# Patient Record
Sex: Female | Born: 1970 | Race: White | Hispanic: No | Marital: Married | Smoking: Never smoker
Health system: Southern US, Community
[De-identification: ages and names within clinical notes are randomized; demographics above are authoritative.]

## PROBLEM LIST (undated history)

## (undated) DIAGNOSIS — K589 Irritable bowel syndrome without diarrhea: Secondary | ICD-10-CM

## (undated) DIAGNOSIS — N879 Dysplasia of cervix uteri, unspecified: Secondary | ICD-10-CM

## (undated) DIAGNOSIS — C4490 Unspecified malignant neoplasm of skin, unspecified: Secondary | ICD-10-CM

## (undated) DIAGNOSIS — I10 Essential (primary) hypertension: Secondary | ICD-10-CM

## (undated) DIAGNOSIS — J302 Other seasonal allergic rhinitis: Secondary | ICD-10-CM

## (undated) DIAGNOSIS — R87629 Unspecified abnormal cytological findings in specimens from vagina: Secondary | ICD-10-CM

## (undated) HISTORY — DX: Irritable bowel syndrome, unspecified: K58.9

## (undated) HISTORY — PX: NECK SURGERY: SHX720

## (undated) HISTORY — PX: BREAST SURGERY: SHX581

## (undated) HISTORY — DX: Essential (primary) hypertension: I10

## (undated) HISTORY — PX: AUGMENTATION MAMMAPLASTY: SUR837

## (undated) HISTORY — DX: Unspecified malignant neoplasm of skin, unspecified: C44.90

## (undated) HISTORY — PX: CERVICAL BIOPSY  W/ LOOP ELECTRODE EXCISION: SUR135

## (undated) HISTORY — DX: Unspecified abnormal cytological findings in specimens from vagina: R87.629

## (undated) HISTORY — PX: BREAST BIOPSY: SHX20

## (undated) HISTORY — PX: COLONOSCOPY: SHX174

## (undated) HISTORY — PX: MANDIBLE SURGERY: SHX707

## (undated) HISTORY — PX: DILATION AND CURETTAGE OF UTERUS: SHX78

## (undated) HISTORY — DX: Dysplasia of cervix uteri, unspecified: N87.9

## (undated) HISTORY — PX: BREAST ENHANCEMENT SURGERY: SHX7

## (undated) HISTORY — PX: ABLATION: SHX5711

## (undated) HISTORY — DX: Other seasonal allergic rhinitis: J30.2

---

## 1990-09-04 HISTORY — PX: WISDOM TOOTH EXTRACTION: SHX21

## 2002-09-04 HISTORY — PX: ABLATION: SHX5711

## 2016-09-04 HISTORY — PX: BREAST ENHANCEMENT SURGERY: SHX7

## 2018-06-04 DEATH — deceased

## 2018-10-30 DIAGNOSIS — M4722 Other spondylosis with radiculopathy, cervical region: Secondary | ICD-10-CM | POA: Insufficient documentation

## 2020-07-23 ENCOUNTER — Emergency Department (HOSPITAL_COMMUNITY): Payer: 59

## 2020-07-23 ENCOUNTER — Emergency Department (HOSPITAL_COMMUNITY)
Admission: EM | Admit: 2020-07-23 | Discharge: 2020-07-24 | Disposition: A | Discharge status: Home / Self Care | Payer: 59 | Attending: Emergency Medicine | Admitting: Emergency Medicine

## 2020-07-23 ENCOUNTER — Encounter (HOSPITAL_COMMUNITY): Payer: Self-pay | Admitting: Emergency Medicine

## 2020-07-23 DIAGNOSIS — F1010 Alcohol abuse, uncomplicated: Secondary | ICD-10-CM | POA: Diagnosis not present

## 2020-07-23 DIAGNOSIS — Z5321 Procedure and treatment not carried out due to patient leaving prior to being seen by health care provider: Secondary | ICD-10-CM | POA: Insufficient documentation

## 2020-07-23 DIAGNOSIS — W19XXXA Unspecified fall, initial encounter: Secondary | ICD-10-CM | POA: Insufficient documentation

## 2020-07-23 DIAGNOSIS — S0990XA Unspecified injury of head, initial encounter: Secondary | ICD-10-CM | POA: Diagnosis present

## 2020-07-23 NOTE — ED Triage Notes (Addendum)
Pt BIB [facility] from a hotel. Fell, lac on center of head. Denies LOC. Admits to heavy ETOH use.

## 2020-07-24 NOTE — ED Notes (Addendum)
Patient left without being seen by a provider. Was seen walking out of the ED in no distress.

## 2020-09-30 LAB — HM MAMMOGRAPHY: HM Mammogram: ABNORMAL — AB (ref 0–4)

## 2021-06-07 ENCOUNTER — Telehealth: Payer: Self-pay | Admitting: *Deleted

## 2021-06-07 NOTE — Telephone Encounter (Signed)
Returned call from 06/06/2021 at 1:46 PM. Left a message that she has scheduled an appointment with [facility], but if she needs [facility] for anything to please call back.

## 2021-06-09 ENCOUNTER — Encounter: Payer: Self-pay | Admitting: Obstetrics and Gynecology

## 2021-06-09 ENCOUNTER — Other Ambulatory Visit: Payer: Self-pay

## 2021-06-09 ENCOUNTER — Ambulatory Visit (INDEPENDENT_AMBULATORY_CARE_PROVIDER_SITE_OTHER): Payer: 59 | Admitting: Obstetrics and Gynecology

## 2021-06-09 VITALS — BP 137/86 | HR 82 | Resp 16 | Ht 66.0 in | Wt 165.0 lb

## 2021-06-09 DIAGNOSIS — R102 Pelvic and perineal pain: Secondary | ICD-10-CM

## 2021-06-09 NOTE — Progress Notes (Signed)
Shooting pelvic pain on Lt.  Pressure all across pelvis    GYNECOLOGY OFFICE VISIT NOTE  History:   Crystal Woods is a 50 y.o. G1P0010 here today for pelvic pain.  She had an ablation 9 years ago - amenorrhea since.  Unsure if menopause - has night sweats, trouble losing weight as well as weight gain. The pain started on Saturday night - feels like an intense pressure that felt similar to UTI but work up was negative at the urgent care. The pain has lessened some but is still quite painful. She has never had pain like this before.   She has diarrhea all morning - watery. No N/V. Has IBS so this is not atypical for her.   She denies any abnormal vaginal discharge, bleeding, or other concerns.     Past Medical History:  Diagnosis Date   Dysplasia of cervix    Hypertension    IBS (irritable bowel syndrome)    Skin cancer    Vaginal Pap smear, abnormal     Past Surgical History:  Procedure Laterality Date   ABLATION     BREAST ENHANCEMENT SURGERY     CERVICAL BIOPSY  W/ LOOP ELECTRODE EXCISION     DILATION AND CURETTAGE OF UTERUS     MANDIBLE SURGERY     NECK SURGERY      The following portions of the patient's history were reviewed and updated as appropriate: allergies, current medications, past family history, past medical history, past social history, past surgical history and problem list.   Health Maintenance:  Normal pap and negative HRHPV on 12/2016.  Last mammogram in January 2022.   Review of Systems:  Pertinent items noted in HPI and remainder of comprehensive ROS otherwise negative.  Physical Exam:  BP 137/86   Pulse 82   Resp 16   Ht 5\' 6"  (1.676 m)   Wt 165 lb (74.8 kg)   BMI 26.63 kg/m  CONSTITUTIONAL: Well-developed, well-nourished female in no acute distress.  HEENT:  Normocephalic, atraumatic. External right and left ear normal. No scleral icterus.  NECK: Normal range of motion, supple, no masses noted on observation SKIN: No rash noted. Not diaphoretic.  No erythema. No pallor. MUSCULOSKELETAL: Normal range of motion. No edema noted. NEUROLOGIC: Alert and oriented to person, place, and time. Normal muscle tone coordination. No cranial nerve deficit noted. PSYCHIATRIC: Normal mood and affect. Normal behavior. Normal judgment and thought content.  CARDIOVASCULAR: Normal heart rate noted RESPIRATORY: Effort and breath sounds normal, no problems with respiration noted ABDOMEN: No masses noted. No other overt distention noted.    PELVIC: Normal appearing external genitalia; normal urethral meatus; normal appearing vaginal mucosa and cervix; cervical os was stenotic.  No abnormal discharge noted.  Normal uterine size, no other palpable masses, moderate uterine or adnexal tenderness. Performed in the presence of a chaperone  Labs and Imaging Fsh 2.8 in 2018 Pap reviewed in care everywhere UA reviewed in care everywhere Assessment and Plan:    1. Pelvic pain - UA negative at urgent care and here - Attempted cervical dilation but pt unable to tolerate anything beyond spinal needle. She declined numbing and would prefer we do it in the OR if needed - Discussed I suspect she has hematometra but it may be other causes eg ovarian cysts, bowel or bladder in origin - We will check TVUS asap and then check [place], E2 level to see where she is in menopausal process - Reviewed last [place] In 2018 was normal.  Routine preventative health maintenance measures emphasized. Please refer to After Visit Summary for other counseling recommendations.   Return in about 3 months (around 09/09/2021).    Radene Gunning, MD, [place] [facility], [place]

## 2021-06-10 ENCOUNTER — Ambulatory Visit (INDEPENDENT_AMBULATORY_CARE_PROVIDER_SITE_OTHER): Payer: 59

## 2021-06-10 DIAGNOSIS — R102 Pelvic and perineal pain: Secondary | ICD-10-CM

## 2021-06-10 LAB — FOLLICLE STIMULATING HORMONE: FSH: 3.4 m[IU]/mL

## 2021-06-10 LAB — ESTRADIOL: Estradiol: 43 pg/mL

## 2021-06-13 ENCOUNTER — Telehealth: Payer: Self-pay | Admitting: Obstetrics and Gynecology

## 2021-06-13 NOTE — Telephone Encounter (Signed)
Spoke with Crystal Woods - her pressure/pain is much improved now - it is almost resolved.   We reviewed [place] in depth. Informed her I reviewed the report and images myself.   We discussed the calcification is not anything I would be concerned about. That has likely been there for some time.   Absence of hematometra is not conclusive yet that she is not cycling given FSH was 3.4 However she will mark her calendar for her period to see if this happens again in a month type interval.   We will watch her for up to 3 months to see if the pain recurs as she may also not be cycling regularly. If it happens again, we would discuss options I.e. hormonal suppression, D&C/hysteroscopy +/- lapaoroscopy vs hysterectomy.   If the pain does not occur again, we would recheck [place] in 3 months. I will send a reminder to myself to ensure we follow up on her in the event she doesn't have pain.   She agrees with plan and will keep [place] apprised of her pain.

## 2021-07-08 ENCOUNTER — Encounter: Payer: 59 | Admitting: Obstetrics and Gynecology

## 2021-08-08 ENCOUNTER — Ambulatory Visit: Payer: 59 | Admitting: Medical-Surgical

## 2021-08-09 ENCOUNTER — Ambulatory Visit: Payer: 59

## 2021-08-26 ENCOUNTER — Telehealth: Payer: Self-pay

## 2021-08-26 ENCOUNTER — Telehealth: Payer: Self-pay | Admitting: *Deleted

## 2021-08-26 NOTE — Telephone Encounter (Signed)
She should contact her prior PCP (whoever was prescribing) for refills until she can get established here. I have never seen her and do not have a basis for safe prescribing. My schedule is full so getting her in sooner is not an option. Alternatively, women's health can temporarily refill her BP meds until she can get established.

## 2021-08-26 NOTE — Telephone Encounter (Signed)
Lysle Morales from [facility] walked over here earlier to give [place] this message regarding patient, about her being out of BP medication and needing a refill. Lysle Morales said while she was here there was nothing they could do about the BP issue and wanted [place] to let a provider know in case there was something we could do.

## 2021-08-26 NOTE — Telephone Encounter (Signed)
Someone from [facility] came over to our office in regards to this patient. Stated she is out of BP medication and needs a refill prior to her appointment. Patient is scheduled for a new patient on 09/28/2021 with Samuel Bouche, she had a new patient appointment scheduled on 08/08/2021 but came in late with no paperwork in hand. Please advise if patient can have an earlier appointment regarding her BP prior to her new patient apt. She was questioning womens health and they passed the word along to [place]. AM

## 2021-08-26 NOTE — Telephone Encounter (Signed)
Patient is not able to schedule at this time, will call back to schedule 3 month F/U with Dr. Damita Dunnings when she is able.

## 2021-08-30 NOTE — Telephone Encounter (Signed)
Patient contacted her previous provider & got refills on BP medication.

## 2021-09-06 ENCOUNTER — Telehealth: Payer: Self-pay

## 2021-09-06 ENCOUNTER — Telehealth: Payer: Self-pay | Admitting: Obstetrics and Gynecology

## 2021-09-06 ENCOUNTER — Other Ambulatory Visit: Payer: Self-pay

## 2021-09-06 DIAGNOSIS — R102 Pelvic and perineal pain: Secondary | ICD-10-CM

## 2021-09-06 NOTE — Telephone Encounter (Signed)
Document opened in error. Will have the office call pt to have her do 3 month follow up US per my last communication with her.

## 2021-09-06 NOTE — Telephone Encounter (Signed)
Spoke with pt about scheduling 3 month f/u u/s per Dr.Duncan. Pt states she is in the hospital with her mother right now and will call office when things settle down with her mother.

## 2021-09-16 ENCOUNTER — Other Ambulatory Visit: Payer: Self-pay

## 2021-09-16 ENCOUNTER — Ambulatory Visit (INDEPENDENT_AMBULATORY_CARE_PROVIDER_SITE_OTHER): Payer: 59

## 2021-09-16 DIAGNOSIS — R102 Pelvic and perineal pain: Secondary | ICD-10-CM

## 2021-09-28 ENCOUNTER — Encounter: Payer: Self-pay | Admitting: Medical-Surgical

## 2021-09-28 ENCOUNTER — Ambulatory Visit (INDEPENDENT_AMBULATORY_CARE_PROVIDER_SITE_OTHER): Payer: 59 | Admitting: Medical-Surgical

## 2021-09-28 ENCOUNTER — Other Ambulatory Visit: Payer: Self-pay

## 2021-09-28 VITALS — BP 138/89 | HR 80 | Resp 20 | Ht 66.0 in | Wt 160.0 lb

## 2021-09-28 DIAGNOSIS — R2231 Localized swelling, mass and lump, right upper limb: Secondary | ICD-10-CM | POA: Diagnosis not present

## 2021-09-28 DIAGNOSIS — R41840 Attention and concentration deficit: Secondary | ICD-10-CM | POA: Diagnosis not present

## 2021-09-28 DIAGNOSIS — I10 Essential (primary) hypertension: Secondary | ICD-10-CM

## 2021-09-28 DIAGNOSIS — Z7689 Persons encountering health services in other specified circumstances: Secondary | ICD-10-CM | POA: Diagnosis not present

## 2021-09-28 DIAGNOSIS — Z636 Dependent relative needing care at home: Secondary | ICD-10-CM

## 2021-09-28 MED ORDER — AMLODIPINE BESYLATE 5 MG PO TABS: 5.0000 mg | ORAL_TABLET | Freq: Every day | ORAL | 3 refills | Status: DC

## 2021-09-28 MED ORDER — HYDROXYZINE HCL 25 MG PO TABS: 25.0000 mg | ORAL_TABLET | Freq: Three times a day (TID) | ORAL | 0 refills | Status: DC | PRN

## 2021-09-28 NOTE — Progress Notes (Signed)
New Patient Office Visit  Subjective:  Patient ID: Crystal Woods, female    DOB: 1970-09-26  Age: 51 y.o. MRN: [id number]  CC:  Chief Complaint  Patient presents with   Establish Care     HPI Crystal Woods presents to establish care. She is a pleasant 51 year old female who currently stays at home to care for her mother and father who both have considerable health issues. She previously sold diamonds for about 20 years.   HTN- takes Amlodipine 5mg  daily and has been on that for around 4 years. Tolerates well without side effects. Uses minimal salt in her foods. Checks her BP at home with normal readings similar to her BP today. Denies CP, SOB, palpitations, lower extremity edema, dizziness, headaches, or vision changes.  Abnormal mammogram- had a mammogram that was abnormal and had a clip placed in the left breast. This is followed by Dr. Damita Dunnings.   Right axillary lump- over the past 5 months or so, has had a lump in the right axilla. It has gotten bigger and is intermittently painful. Not bothersome today but she is worried about what it may be. Has not been previously evaluated.   Inattention- has noted issues with inattention and poor focus. Her friend told her about Adderall and she took a 5mg  dose to see how she responded. Notes that she was able to focus a lot better. Interested in testing for ADHD.   Stress- having difficulty with caregiver stress. Trouble calming herself at night. Had an old prescription for Xanax years ago that she used but threw them away because they were expired. Wonders if she can get a new prescription for Xanax to calm her at night.   Past Medical History:  Diagnosis Date   Dysplasia of cervix    Hypertension    IBS (irritable bowel syndrome)    Skin cancer    Vaginal Pap smear, abnormal     Past Surgical History:  Procedure Laterality Date   ABLATION     BREAST ENHANCEMENT SURGERY     BREAST SURGERY     Four years ago   CERVICAL BIOPSY  W/ LOOP  ELECTRODE EXCISION     DILATION AND CURETTAGE OF UTERUS     MANDIBLE SURGERY     NECK SURGERY      Family History  Problem Relation Age of Onset   Hypertension Mother    Diabetes Mother    Heart murmur Mother    Diabetes Father    Hypertension Father    Parkinson's disease Father    Heart disease Father     Social History   Socioeconomic History   Marital status: Married    Spouse name: Not on file   Number of children: Not on file   Years of education: Not on file   Highest education level: Not on file  Occupational History   Not on file  Tobacco Use   Smoking status: Never   Smokeless tobacco: Never  Vaping Use   Vaping Use: Never used  Substance and Sexual Activity   Alcohol use: Yes    Alcohol/week: 5.0 standard drinks    Types: 3 Glasses of wine, 2 Shots of liquor per week   Drug use: Never   Sexual activity: Yes    Partners: Male    Birth control/protection: Surgical  Other Topics Concern   Not on file  Social History Narrative   Not on file   Social Determinants of Health   Financial Resource Strain:  Not on file  Food Insecurity: Not on file  Transportation Needs: Not on file  Physical Activity: Not on file  Stress: Not on file  Social Connections: Not on file  Intimate Partner Violence: Not on file    ROS Review of Systems  Constitutional:  Negative for chills, fatigue, fever and unexpected weight change.  HENT:  Negative for congestion, rhinorrhea, sinus pressure and sore throat.   Respiratory:  Negative for cough, chest tightness and shortness of breath.   Cardiovascular:  Negative for chest pain, palpitations and leg swelling.  Gastrointestinal:  Negative for abdominal pain, constipation, diarrhea, nausea and vomiting.  Endocrine: Negative for cold intolerance and heat intolerance.  Genitourinary:  Negative for dysuria, frequency and urgency.  Skin:  Negative for rash and wound.  Neurological:  Negative for dizziness, light-headedness and  headaches.  Hematological:  Does not bruise/bleed easily.  Psychiatric/Behavioral:  Positive for decreased concentration and dysphoric mood. Negative for self-injury, sleep disturbance and suicidal ideas. The patient is nervous/anxious.    Objective:   Today's Vitals: BP 138/89 (BP Location: Left Arm, Patient Position: Sitting, Cuff Size: Normal)    Pulse 80    Resp 20    Ht 5\' 6"  (1.676 m)    Wt 160 lb (72.6 kg)    SpO2 98%    BMI 25.82 kg/m   Physical Exam Vitals reviewed.  Constitutional:      General: She is not in acute distress.    Appearance: Normal appearance. She is normal weight. She is not ill-appearing.  HENT:     Head: Normocephalic and atraumatic.  Cardiovascular:     Rate and Rhythm: Normal rate and regular rhythm.     Pulses: Normal pulses.     Heart sounds: Normal heart sounds. No murmur heard.   No friction rub. No gallop.  Pulmonary:     Effort: Pulmonary effort is normal. No respiratory distress.     Breath sounds: Normal breath sounds. No wheezing.  Musculoskeletal:     Comments: Unable to palpate a lump in the right axilla. No redness, swelling, or tenderness noted. Unable to reproduce discomfort during the exam.   Skin:    General: Skin is warm and dry.  Neurological:     Mental Status: She is alert and oriented to person, place, and time.  Psychiatric:        Mood and Affect: Mood normal.        Behavior: Behavior normal.        Thought Content: Thought content normal.        Judgment: Judgment normal.    Assessment & Plan:   1. Encounter to establish care Reviewed available information and discussed care concerns with patient.   2. Inattention Referring to psychology for ADHD testing. Patient advised that I am unable to provide Adderall without a formal diagnosis due to its status as a controlled substance and its high potential for misuse.  - Ambulatory referral to Psychology  3. Axillary lump, right Unable to palpate a defined lump today.  Since this is intermittent, could be a lymph node. Getting ultrasound for further investigation.  - [place] AXILLA RIGHT; Future  4. Primary hypertension BP looks good today. Advised to avoid adding salt to foods. Continue Amlodipine 5mg  daily. Continue checking BP at home with a goal of 130/80 or less.   5. Caregiver stress Discussed use of benzodiazepines for chronic stress. Recommend trialing medications that are less likely to cause addiction and dependence. Patient is agreeable so  sending in hydroxyzine prn. Advised that this may cause sedation so try lower doses and work up to 50mg  as needed.   Outpatient Encounter Medications as of 09/28/2021  Medication Sig   hydrOXYzine (ATARAX) 25 MG tablet Take 1-2 tablets (25-50 mg total) by mouth 3 (three) times daily as needed. May cause sedation.   Levocetirizine Dihydrochloride (XYZAL PO) Take by mouth at bedtime.   Magnesium 400 MG CAPS Take by mouth.   magnesium oxide (MAG-OX) 400 MG tablet Take by mouth.   Multiple Vitamin (MULTIVITAMIN ADULT PO) Take by mouth.   Probiotic Product (PROBIOTIC-10) CHEW Chew by mouth.   [DISCONTINUED] amLODipine (NORVASC) 5 MG tablet Take by mouth.   amLODipine (NORVASC) 5 MG tablet Take 1 tablet (5 mg total) by mouth daily.   No facility-administered encounter medications on file as of 09/28/2021.    Follow-up: Return in about 6 weeks (around 11/09/2021) for mood follow up.   Clearnce Sorrel, DNP, APRN, FNP-BC [facility]

## 2021-10-01 ENCOUNTER — Other Ambulatory Visit: Payer: Self-pay | Admitting: Medical-Surgical

## 2021-10-01 DIAGNOSIS — R2231 Localized swelling, mass and lump, right upper limb: Secondary | ICD-10-CM

## 2021-11-07 ENCOUNTER — Encounter: Payer: Self-pay | Admitting: Medical-Surgical

## 2021-11-11 IMAGING — CT CT HEAD W/O CM
3 series · 16 of 47 positions shown, 19 images · non-contrast
Comparison: None.

CLINICAL DATA: Fall, altered mental status

EXAM:
CT HEAD WITHOUT CONTRAST
TECHNIQUE: Contiguous axial images were obtained from the base of the skull
through the vertex without intravenous contrast.

[Series 2: head wo · axial · 0.40mm/px · z∈[-94,+31]mm · 10 of 30 slices shown, 13 images]
[im 3/30  brain]
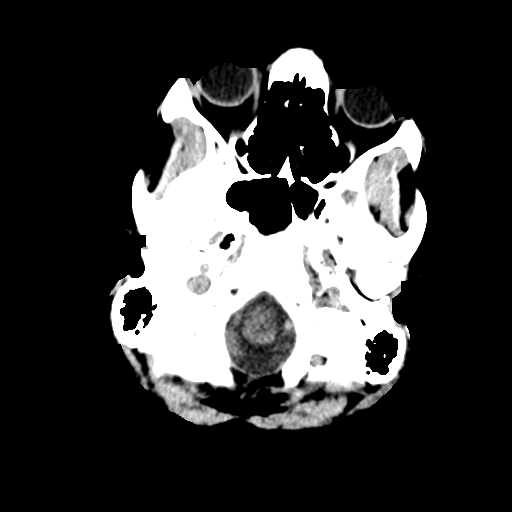
[im 3/30  bone]
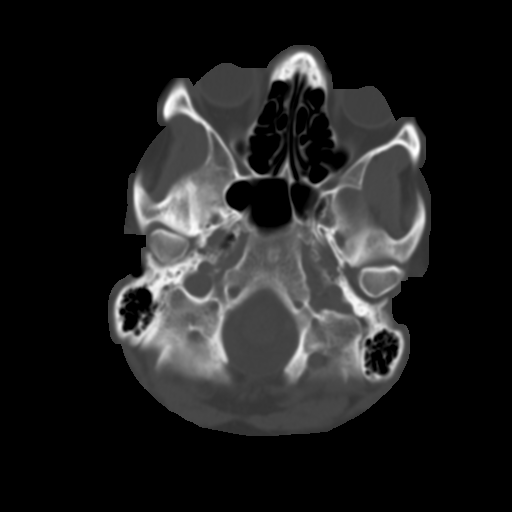
[im 6/30  brain]
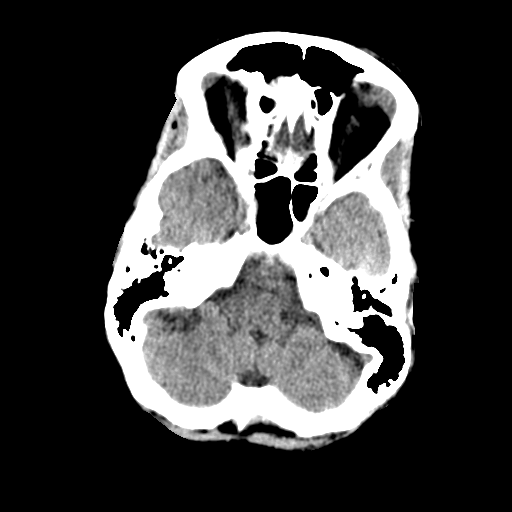
[im 9/30  brain]
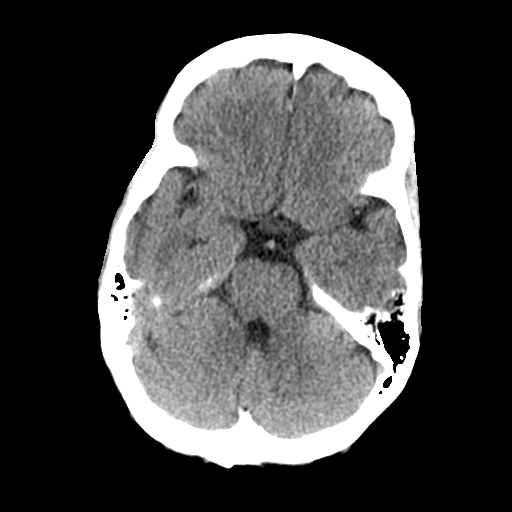
[im 11/30  brain]
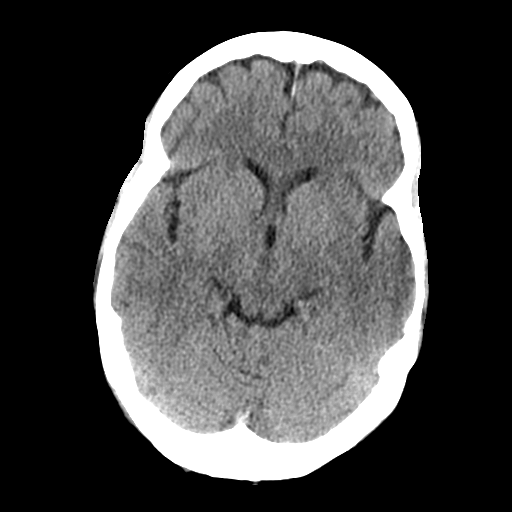
[im 14/30  brain]
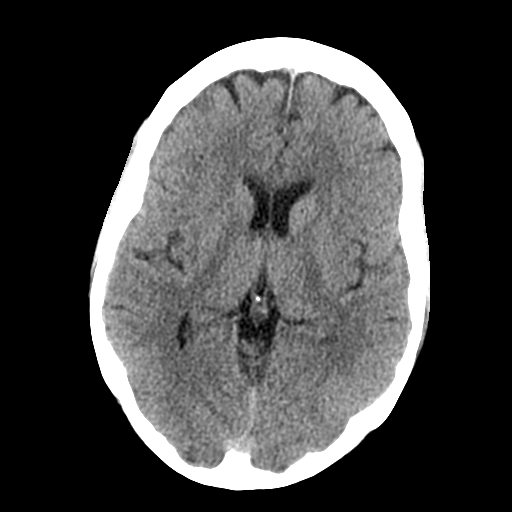
[im 14/30  bone]
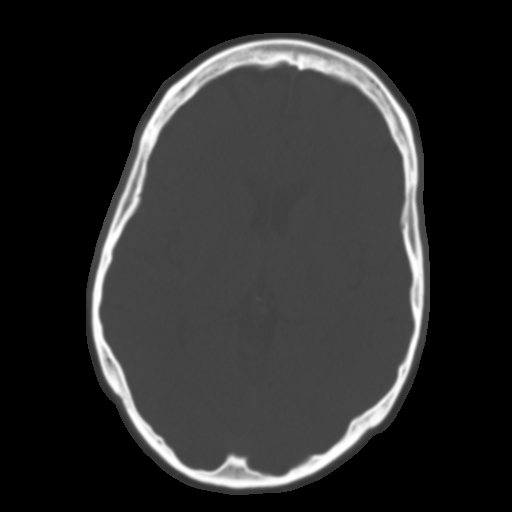
[im 17/30  brain]
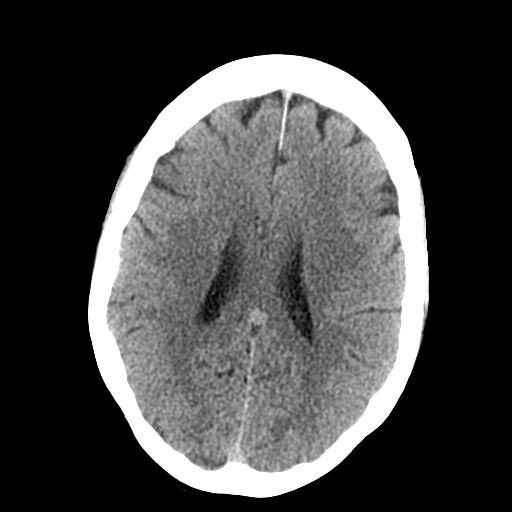
[im 20/30  brain]
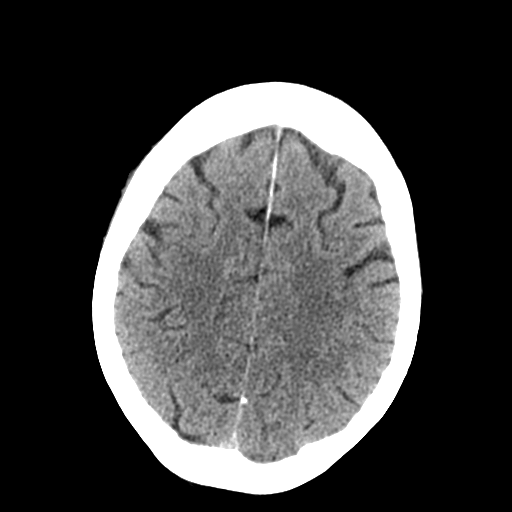
[im 23/30  brain]
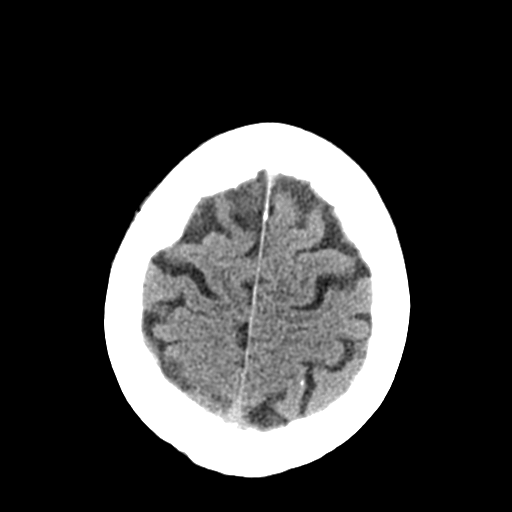
[im 25/30  brain]
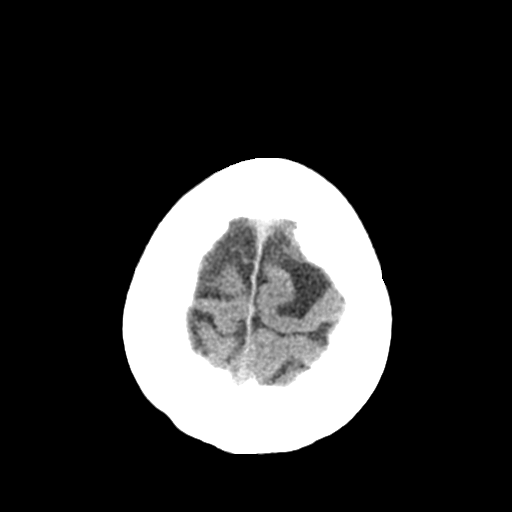
[im 25/30  bone]
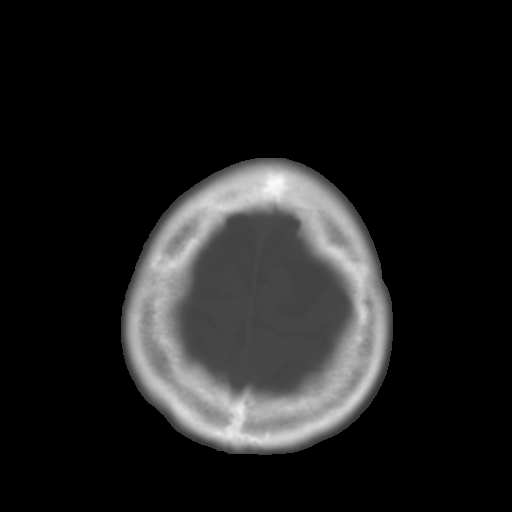
[im 28/30  brain]
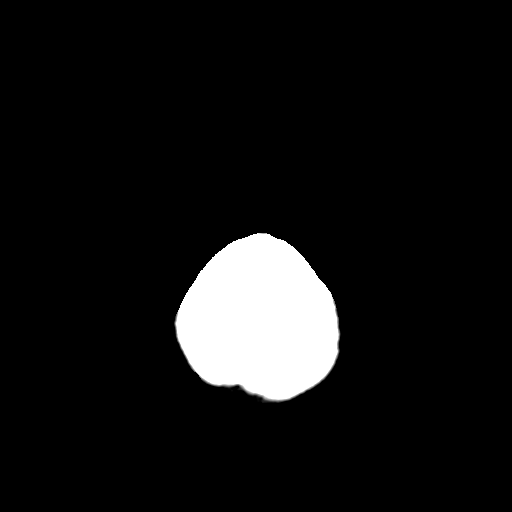

[Series 5: coronal soft tissue · coronal · 0.29mm/px · 3 of 66 slices shown]
[im 22/66  brain]
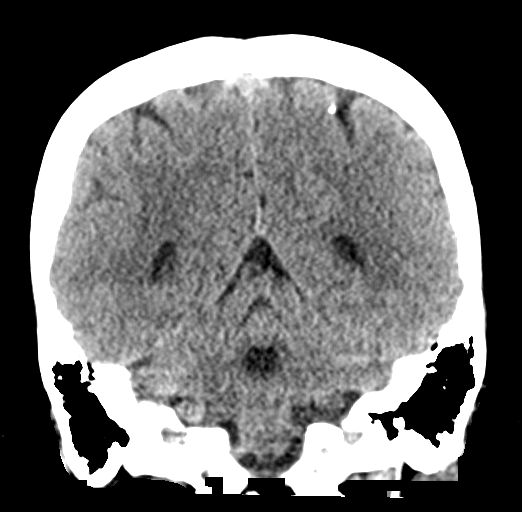
[im 29/66  brain]
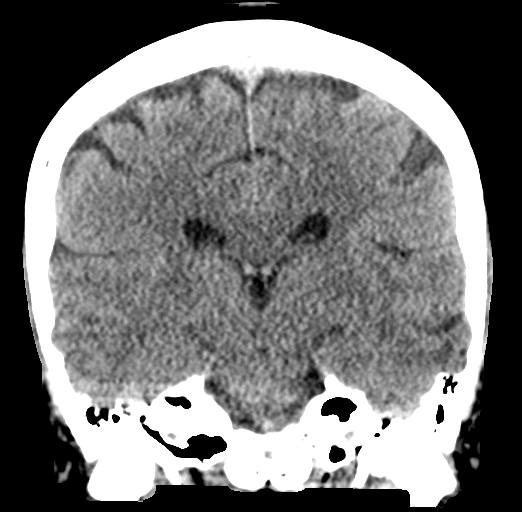
[im 37/66  brain]
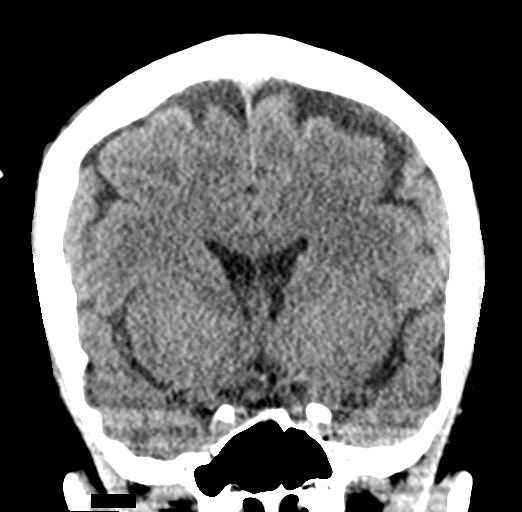

[Series 6: sagittal soft tissue · sagittal · 0.29mm/px · 3 of 51 slices shown]
[im 17/51  brain]
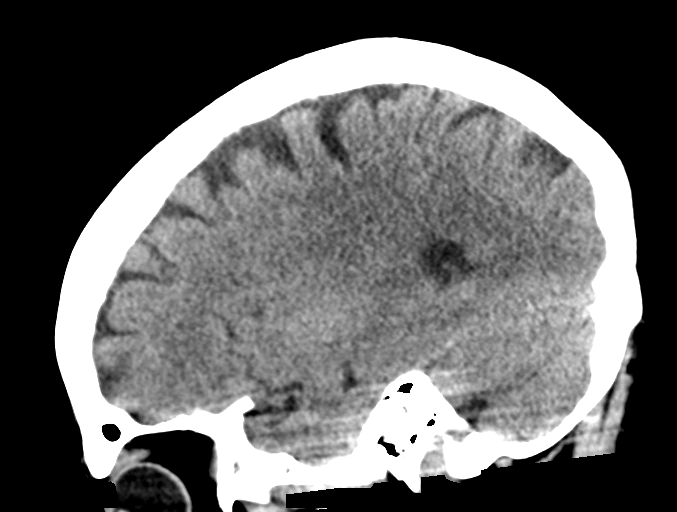
[im 26/51  brain]
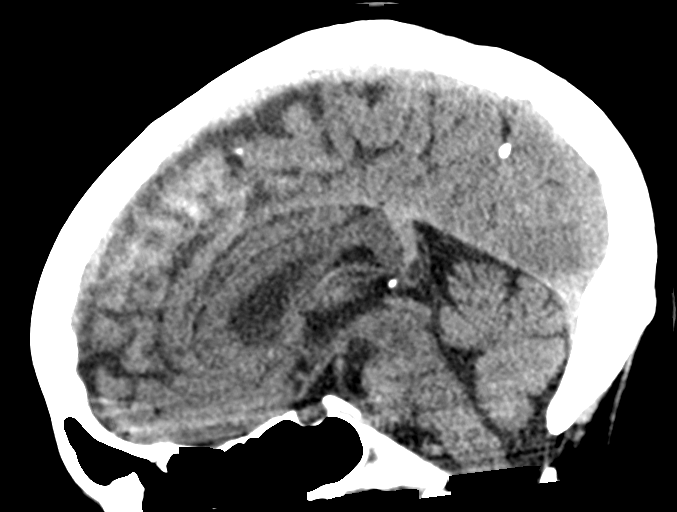
[im 34/51  brain]
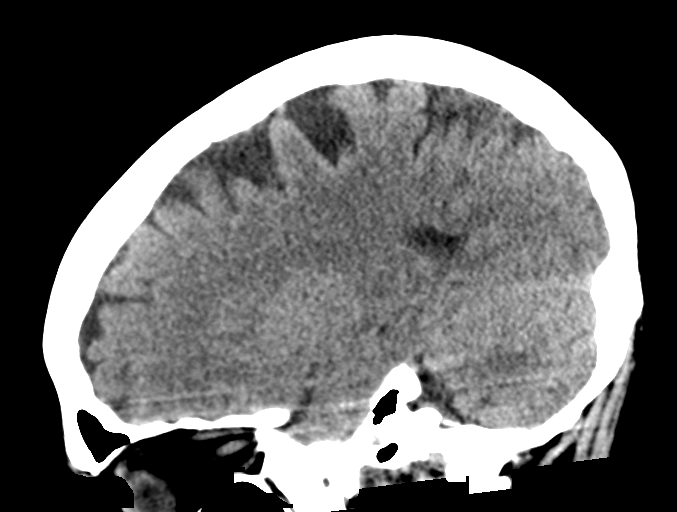

[16 of 47 positions shown; findings below may reference images not displayed]

FINDINGS: Brain: No acute intracranial abnormality. Specifically, no
hemorrhage, hydrocephalus, mass lesion, acute infarction, or
significant intracranial injury.

Vascular: No hyperdense vessel or unexpected calcification.

Skull: No acute calvarial abnormality.

Sinuses/Orbits: Visualized paranasal sinuses and mastoids clear.
Orbital soft tissues unremarkable.

Other: None
IMPRESSION: Normal study.

## 2022-08-24 ENCOUNTER — Encounter: Payer: Self-pay | Admitting: Medical-Surgical

## 2022-08-24 ENCOUNTER — Ambulatory Visit (INDEPENDENT_AMBULATORY_CARE_PROVIDER_SITE_OTHER): Payer: 59 | Admitting: Medical-Surgical

## 2022-08-24 VITALS — BP 131/85 | HR 91 | Resp 20 | Ht 66.0 in | Wt 159.2 lb

## 2022-08-24 DIAGNOSIS — Z1211 Encounter for screening for malignant neoplasm of colon: Secondary | ICD-10-CM | POA: Diagnosis not present

## 2022-08-24 DIAGNOSIS — Z0000 Encounter for general adult medical examination without abnormal findings: Secondary | ICD-10-CM

## 2022-08-24 DIAGNOSIS — Z13220 Encounter for screening for lipoid disorders: Secondary | ICD-10-CM

## 2022-08-24 DIAGNOSIS — I10 Essential (primary) hypertension: Secondary | ICD-10-CM | POA: Diagnosis not present

## 2022-08-24 DIAGNOSIS — Z1231 Encounter for screening mammogram for malignant neoplasm of breast: Secondary | ICD-10-CM

## 2022-08-24 NOTE — Progress Notes (Signed)
Complete physical exam  Patient: Crystal Woods   DOB: 1971-04-02   51 y.o. Female  MRN: [id number]  Subjective:    Chief Complaint  Patient presents with   Annual Exam    Crystal Woods is a 51 y.o. female who presents today for a complete physical exam. She reports consuming a general and IBS limited  diet.  Exercises at least 4 days a week at the gym.  She generally feels well. She reports sleeping well. She does not have additional problems to discuss today.    Most recent fall risk assessment:    09/28/2021    9:38 AM  [place] in the past year? 0  Number falls in past yr: 0  Injury with Fall? 0  Risk for fall due to : No Fall Risks  Follow up Falls evaluation completed     Most recent depression screenings:    08/24/2022    9:23 AM 09/28/2021    9:38 AM  PHQ 2/9 Scores  PHQ - 2 Score 0 0  PHQ- 9 Score  0    Vision:Not within last year , Dental: No current dental problems and Receives regular dental care, and STD: The patient denies history of sexually transmitted disease.    Patient Care Team: Samuel Bouche, NP as PCP - General (Nurse Practitioner)   Outpatient Medications Prior to Visit  Medication Sig   amLODipine (NORVASC) 5 MG tablet Take 1 tablet (5 mg total) by mouth daily.   hydrOXYzine (ATARAX) 25 MG tablet Take 1-2 tablets (25-50 mg total) by mouth 3 (three) times daily as needed. May cause sedation.   Levocetirizine Dihydrochloride (XYZAL PO) Take by mouth at bedtime.   Magnesium 400 MG CAPS Take by mouth.   magnesium oxide (MAG-OX) 400 MG tablet Take by mouth.   Multiple Vitamin (MULTIVITAMIN ADULT PO) Take by mouth.   Probiotic Product (PROBIOTIC-10) CHEW Chew by mouth.   No facility-administered medications prior to visit.    Review of Systems  Constitutional:  Negative for chills, fever, malaise/fatigue and weight loss.  HENT:  Negative for congestion, ear pain, hearing loss, sinus pain and sore throat.   Eyes:  Negative for blurred  vision, photophobia and pain.  Respiratory:  Negative for cough, shortness of breath and wheezing.   Cardiovascular:  Negative for chest pain, palpitations and leg swelling.  Gastrointestinal:  Negative for abdominal pain, constipation, diarrhea, heartburn, nausea and vomiting.  Genitourinary:  Negative for dysuria, frequency and urgency.  Musculoskeletal:  Negative for falls and neck pain.  Skin:  Negative for itching and rash.  Neurological:  Negative for dizziness, weakness and headaches.  Endo/Heme/Allergies:  Negative for polydipsia. Does not bruise/bleed easily.  Psychiatric/Behavioral:  Negative for depression, substance abuse and suicidal ideas. The patient is not nervous/anxious.      Objective:    BP 131/85 (BP Location: Right Arm, Cuff Size: Normal)   Pulse 91   Resp 20   Ht '5\' 6"'$  (1.676 m)   Wt 159 lb 3.2 oz (72.2 kg)   SpO2 99%   BMI 25.70 kg/m    Physical Exam Constitutional:      General: She is not in acute distress.    Appearance: Normal appearance. She is not ill-appearing.  HENT:     Head: Normocephalic and atraumatic.     Right Ear: Tympanic membrane, ear canal and external ear normal. There is no impacted cerumen.     Left Ear: Tympanic membrane, ear canal and external  ear normal. There is no impacted cerumen.     Nose: Nose normal. No congestion or rhinorrhea.     Mouth/Throat:     Mouth: Mucous membranes are moist.     Pharynx: No oropharyngeal exudate or posterior oropharyngeal erythema.  Eyes:     General: No scleral icterus.       Right eye: No discharge.        Left eye: No discharge.     Extraocular Movements: Extraocular movements intact.     Conjunctiva/sclera: Conjunctivae normal.     Pupils: Pupils are equal, round, and reactive to light.  Neck:     Thyroid: No thyromegaly.     Vascular: No carotid bruit or JVD.     Trachea: Trachea normal.  Cardiovascular:     Rate and Rhythm: Normal rate and regular rhythm.     Pulses: Normal pulses.      Heart sounds: Normal heart sounds. No murmur heard.    No friction rub. No gallop.  Pulmonary:     Effort: Pulmonary effort is normal. No respiratory distress.     Breath sounds: Normal breath sounds. No wheezing.  Abdominal:     General: Bowel sounds are normal. There is no distension.     Palpations: Abdomen is soft.     Tenderness: There is no abdominal tenderness. There is no guarding.  Musculoskeletal:        General: Normal range of motion.     Cervical back: Normal range of motion and neck supple.  Skin:    General: Skin is warm and dry.  Neurological:     Mental Status: She is alert and oriented to person, place, and time.     Cranial Nerves: No cranial nerve deficit.  Psychiatric:        Mood and Affect: Mood normal.        Behavior: Behavior normal.        Thought Content: Thought content normal.        Judgment: Judgment normal.   No results found for any visits on 08/24/22.     Assessment & Plan:    Routine Health Maintenance and Physical Exam  Immunization History  Administered Date(s) Administered   Tdap 09/04/2014   Zoster Recombinat (Shingrix) 03/01/2022    Health Maintenance  Topic Date Due   PAP SMEAR-Modifier  Never done   COLONOSCOPY (Pts 45-11yr Insurance coverage will need to be confirmed)  Never done   Zoster Vaccines- Shingrix (2 of 2) 04/26/2022   MAMMOGRAM  09/30/2022   DTaP/Tdap/Td (2 - Td or Tdap) 09/04/2024   HPV VACCINES  Aged Out   INFLUENZA VACCINE  Discontinued   Hepatitis C Screening  Discontinued   HIV Screening  Discontinued    Discussed health benefits of physical activity, and encouraged her to engage in regular exercise appropriate for her age and condition.  1. Annual physical exam Checking labs as below. UTD on preventative care. Wellness information provided with AVS. - CBC with Differential/Platelet - COMPLETE METABOLIC PANEL WITH GFR - Lipid panel  2. Lipid screening Checking lipids.  - Lipid panel  3.  Primary hypertension BP at goal. Continue Amlodipine '5mg'$  daily. Continue to monitor BP at home with a goal of 130/80 or less. Recommend low sodium diet, regular intentional exercise, and maintaining a healthy weight.   4. Colon cancer screening Referring to GI for colonoscopy.  - Ambulatory referral to Gastroenterology  5. Encounter for screening mammogram for malignant neoplasm of breast Previous mammogram  abnormal. Due for repeat but has had trouble getting scheduled. Ordered today to facilitate completion.  - MM DIAG BREAST TOMO BILATERAL; Future  Return in about 1 year (around 08/25/2023) for annual physical exam or sooner if needed.   Samuel Bouche, NP

## 2022-08-25 LAB — CBC WITH DIFFERENTIAL/PLATELET
Absolute Monocytes: 650 cells/uL (ref 200–950)
Basophils Absolute: 60 cells/uL (ref 0–200)
Basophils Relative: 0.9 %
Eosinophils Absolute: 40 cells/uL (ref 15–500)
Eosinophils Relative: 0.6 %
HCT: 41.6 % (ref 35.0–45.0)
Hemoglobin: 14.8 g/dL (ref 11.7–15.5)
Lymphs Abs: 2017 cells/uL (ref 850–3900)
MCH: 34.7 pg — ABNORMAL HIGH (ref 27.0–33.0)
MCHC: 35.6 g/dL (ref 32.0–36.0)
MCV: 97.7 fL (ref 80.0–100.0)
MPV: 9.2 fL (ref 7.5–12.5)
Monocytes Relative: 9.7 %
Neutro Abs: 3933 cells/uL (ref 1500–7800)
Neutrophils Relative %: 58.7 %
Platelets: 351 10*3/uL (ref 140–400)
RBC: 4.26 10*6/uL (ref 3.80–5.10)
RDW: 11.7 % (ref 11.0–15.0)
Total Lymphocyte: 30.1 %
WBC: 6.7 10*3/uL (ref 3.8–10.8)

## 2022-08-25 LAB — COMPLETE METABOLIC PANEL WITH GFR
AG Ratio: 1.7 (calc) (ref 1.0–2.5)
ALT: 18 U/L (ref 6–29)
AST: 23 U/L (ref 10–35)
Albumin: 4.7 g/dL (ref 3.6–5.1)
Alkaline phosphatase (APISO): 62 U/L (ref 37–153)
BUN: 10 mg/dL (ref 7–25)
CO2: 26 mmol/L (ref 20–32)
Calcium: 9.8 mg/dL (ref 8.6–10.4)
Chloride: 99 mmol/L (ref 98–110)
Creat: 0.75 mg/dL (ref 0.50–1.03)
Globulin: 2.7 g/dL (calc) (ref 1.9–3.7)
Glucose, Bld: 80 mg/dL (ref 65–99)
Potassium: 4.1 mmol/L (ref 3.5–5.3)
Sodium: 136 mmol/L (ref 135–146)
Total Bilirubin: 0.4 mg/dL (ref 0.2–1.2)
Total Protein: 7.4 g/dL (ref 6.1–8.1)
eGFR: 96 mL/min/{1.73_m2} (ref >=60)

## 2022-08-25 LAB — LIPID PANEL
Cholesterol: 237 mg/dL — ABNORMAL HIGH (ref <200)
HDL: 86 mg/dL (ref >=50)
LDL Cholesterol (Calc): 137 mg/dL (calc) — ABNORMAL HIGH
Non-HDL Cholesterol (Calc): 151 mg/dL (calc) — ABNORMAL HIGH (ref <130)
Total CHOL/HDL Ratio: 2.8 (calc) (ref <5.0)
Triglycerides: 52 mg/dL (ref <150)

## 2022-09-06 ENCOUNTER — Encounter: Payer: Self-pay | Admitting: Internal Medicine

## 2022-09-29 IMAGING — US US PELVIS COMPLETE WITH TRANSVAGINAL
1 series · 13 of 25 positions shown · non-contrast
Comparison: None

CLINICAL DATA: Pelvic pain, post endometrial ablation 9 years ago,
question hematometra; postmenopausal



[Series 1: us pelvic complete with transvaginal · 13 of 101 slices shown]
[im 1/101]
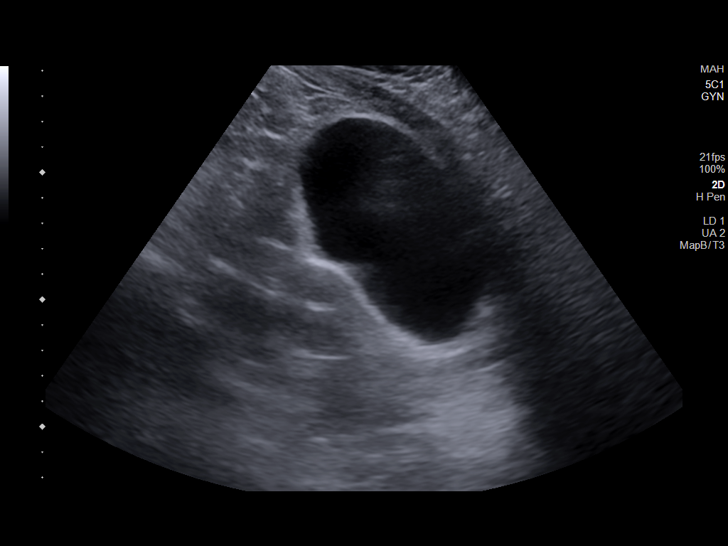
[im 9/101]
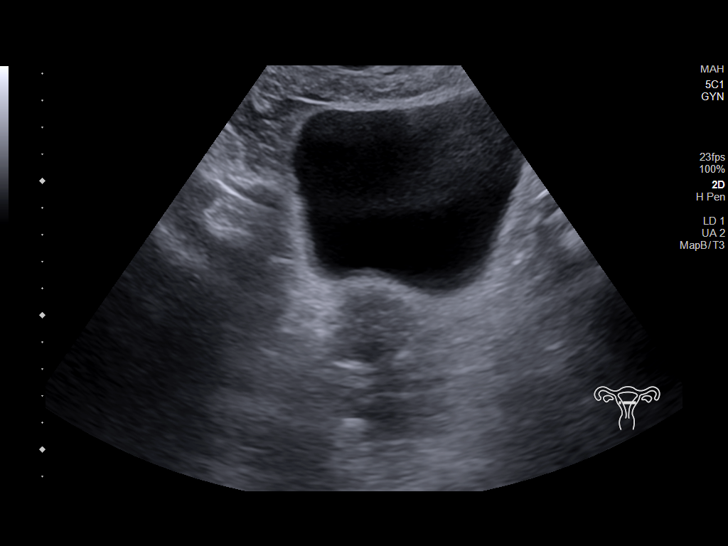
[im 17/101]
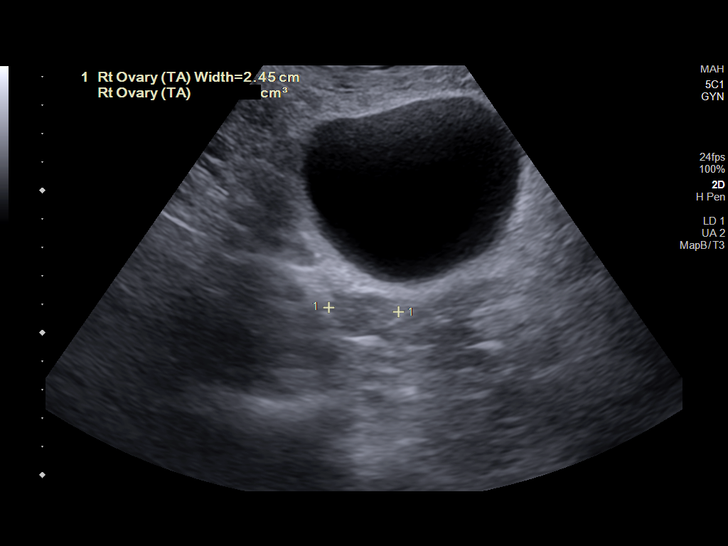
[im 26/101]
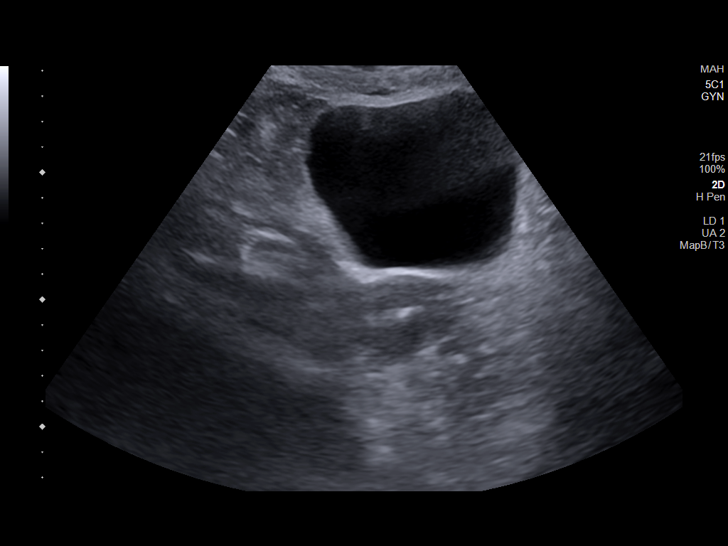
[im 34/101]
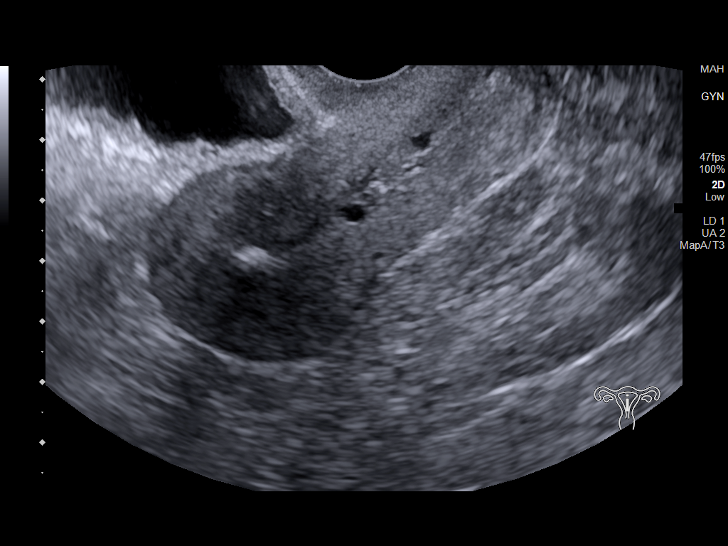
[im 42/101]
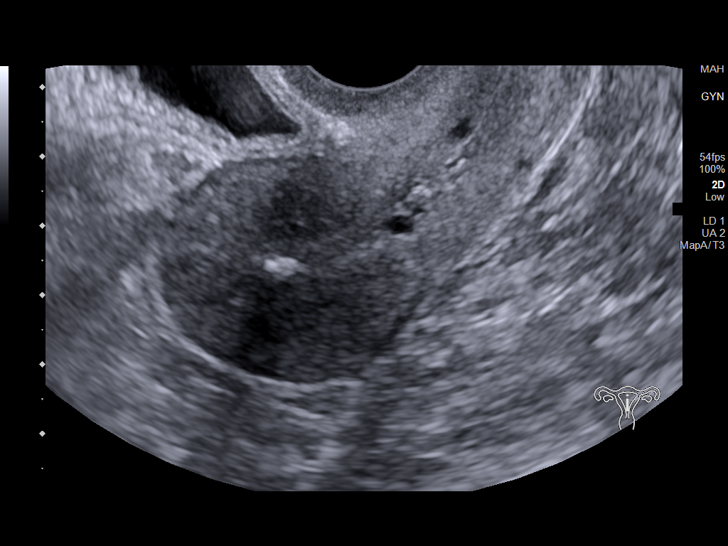
[im 51/101]
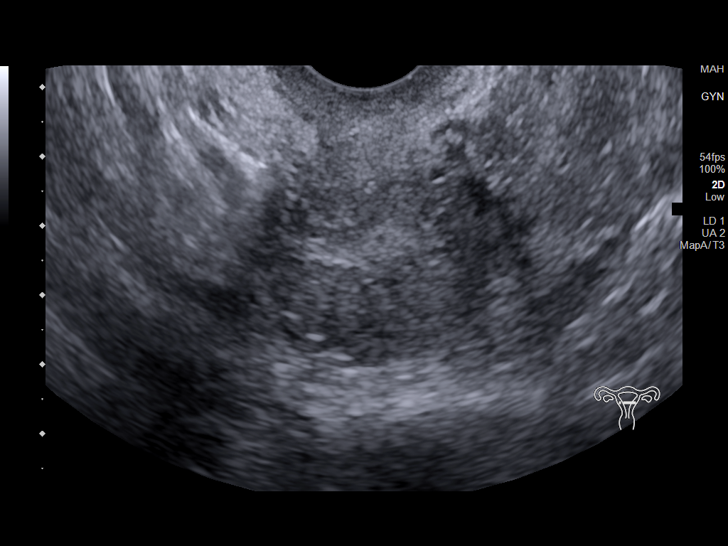
[im 59/101]
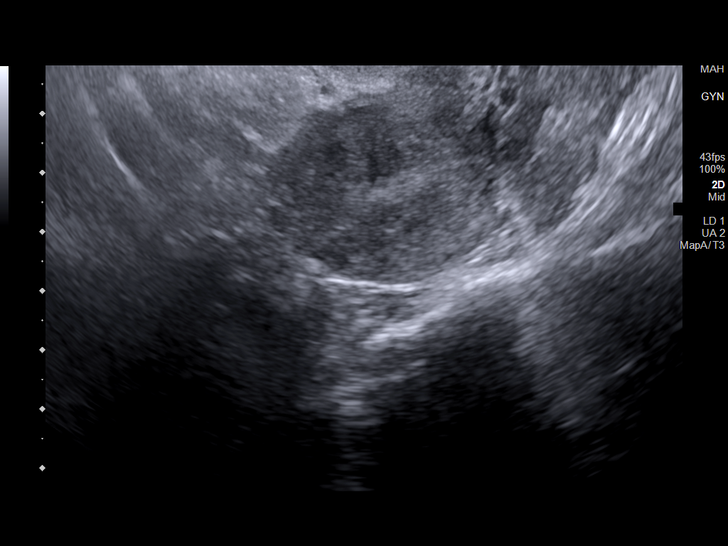
[im 67/101]
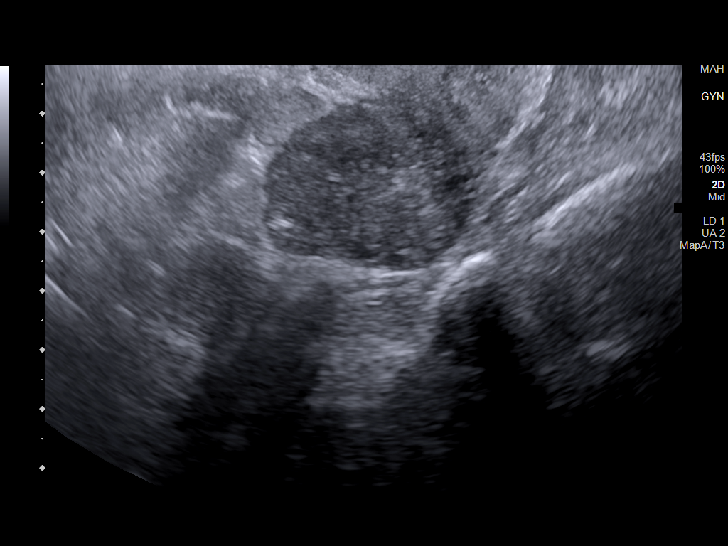
[im 76/101]
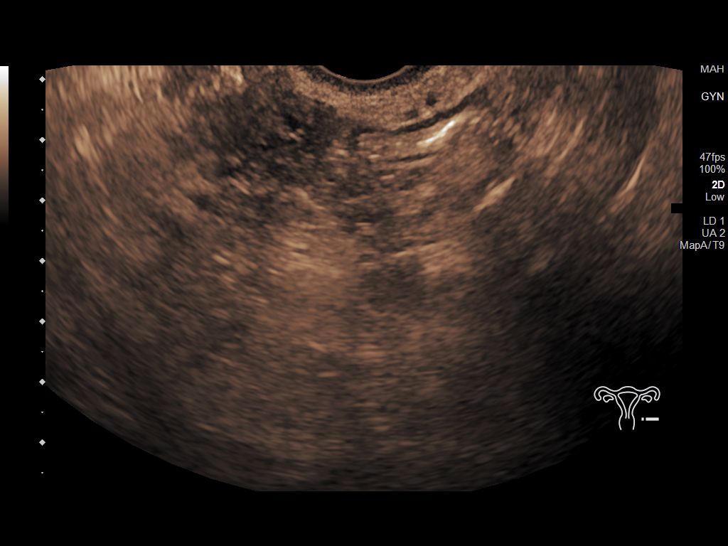
[im 84/101]
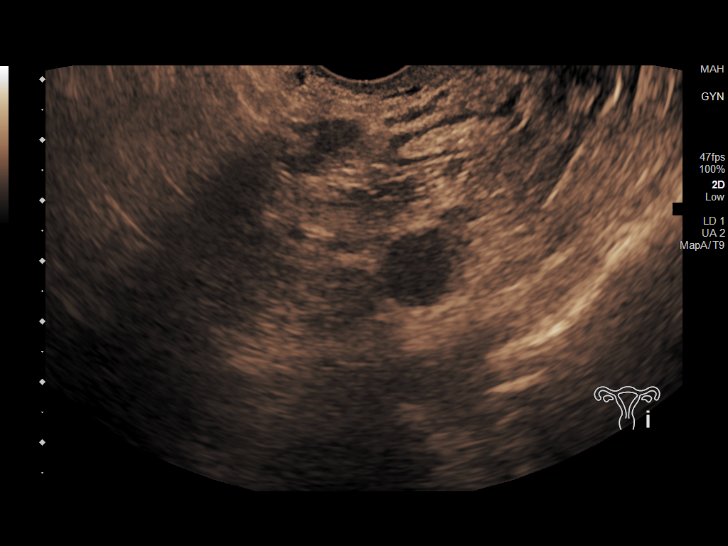
[im 92/101]
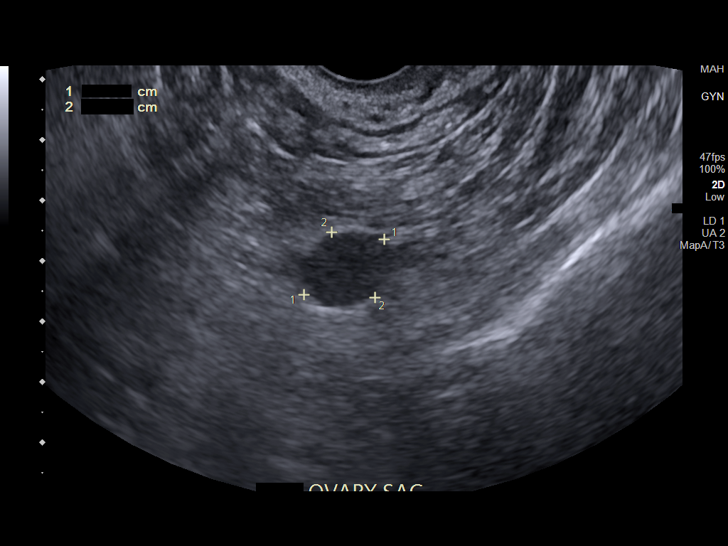
[im 101/101]
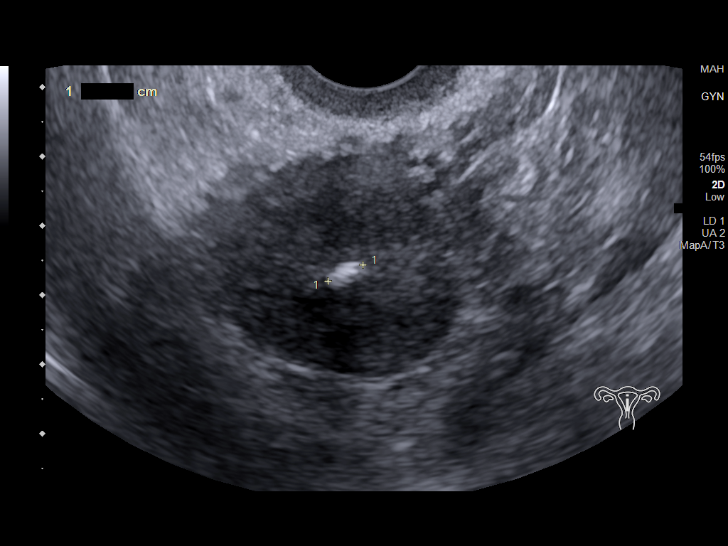

[13 of 25 positions shown; findings below may reference images not displayed]

FINDINGS: Uterus

Measurements: 7.6 x 3.2 x 4.5 cm = volume: 58 mL. Anteverted.
Atrophic. Heterogeneous myometrium without focal mass. Nabothian
cyst at cervix.

Endometrium

Thickness: 5 mm. Echogenic focus with shadowing at fundus question
calcification. No endometrial fluid or mass otherwise seen.

Right ovary

Measurements: 2.0 x 1.1 x 1.8 cm = volume: 2.2 mL. Normal morphology
without mass

Left ovary

Measurements: 1.8 x 1.0 x 1.5 cm = volume: 1.5 mL. Questionable
exophytic lesion versus paraovarian lesion see below.

Other findings

No free pelvic fluid. Hypoechoic nodule LEFT adnexa 16 x 13 x 12 mm
containing low level internal echoes, question complicated exophytic
LEFT ovarian versus paraovarian cyst.
IMPRESSION: Focal calcification at the fundal portion of the endometrial
complex, nonspecific.

No other uterine or endometrial abnormalities.

16 mm complicated cyst with low level internal echogenicity adjacent
to LEFT ovary, question exophytic ovarian versus paraovarian cyst;
follow-up ultrasound recommended in 6-12 weeks to reassess.

## 2022-10-02 ENCOUNTER — Ambulatory Visit (AMBULATORY_SURGERY_CENTER): Payer: 59

## 2022-10-02 VITALS — Ht 66.0 in | Wt 155.0 lb

## 2022-10-02 DIAGNOSIS — Z1211 Encounter for screening for malignant neoplasm of colon: Secondary | ICD-10-CM

## 2022-10-02 MED ORDER — NA SULFATE-K SULFATE-MG SULF 17.5-3.13-1.6 GM/177ML PO SOLN: 1.0000 | Freq: Once | ORAL | 0 refills | Status: AC

## 2022-10-02 NOTE — Progress Notes (Signed)
Pre visit completed via phone call; Patient verified name, DOB, and address;  No egg or soy allergy known to patient  No issues known to pt with past sedation with any surgeries or procedures Patient denies ever being told they had issues or difficulty with intubation  No FH of Malignant Hyperthermia Pt is not on diet pills Pt is not on home 02  Pt is not on blood thinners  Pt reports issues with constipation-patient has been dx with IBS-C- patient reports she takes Miralax when she is constipated- also takes magnesium-  No A fib or A flutter Have any cardiac testing pending--NO Pt instructed to use Singlecare.com or GoodRx for a price reduction on prep   Insurance verified during [place] appt=[employer]  Patient's chart reviewed by Osvaldo Angst CNRA prior to previsit and patient appropriate for the [facility].  Previsit completed and red dot placed by patient's name on their procedure day (on provider's schedule).    Instructions printed and placed at 2nd floor receptionist for patient to pick up at her request; patient reports she is going to try and get into her MyChart account first, if not she will come and get instructions;

## 2022-10-11 ENCOUNTER — Encounter: Payer: Self-pay | Admitting: Internal Medicine

## 2022-10-17 ENCOUNTER — Encounter: Payer: Self-pay | Admitting: Certified Registered Nurse Anesthetist

## 2022-10-23 ENCOUNTER — Ambulatory Visit (AMBULATORY_SURGERY_CENTER): Payer: 59 | Admitting: Internal Medicine

## 2022-10-23 ENCOUNTER — Encounter: Payer: Self-pay | Admitting: Internal Medicine

## 2022-10-23 VITALS — BP 122/75 | HR 74 | Temp 98.0°F | Resp 15 | Ht 66.0 in | Wt 155.0 lb

## 2022-10-23 DIAGNOSIS — D120 Benign neoplasm of cecum: Secondary | ICD-10-CM

## 2022-10-23 DIAGNOSIS — Z1211 Encounter for screening for malignant neoplasm of colon: Secondary | ICD-10-CM | POA: Diagnosis present

## 2022-10-23 DIAGNOSIS — D122 Benign neoplasm of ascending colon: Secondary | ICD-10-CM

## 2022-10-23 DIAGNOSIS — K635 Polyp of colon: Secondary | ICD-10-CM | POA: Diagnosis not present

## 2022-10-23 MED ORDER — SODIUM CHLORIDE 0.9 % IV SOLN: 500.0000 mL | Freq: Once | INTRAVENOUS | Status: DC

## 2022-10-23 NOTE — Progress Notes (Signed)
Report given to PACU, vss 

## 2022-10-23 NOTE — Progress Notes (Signed)
GASTROENTEROLOGY PROCEDURE H&P NOTE   Primary Care Physician: Samuel Bouche, NP    Reason for Procedure:   Colon cancer screening  Plan:    Colonoscopy   Patient is appropriate for endoscopic procedure(s) in the ambulatory ([place]) setting.  The nature of the procedure, as well as the risks, benefits, and alternatives were carefully and thoroughly reviewed with the patient. Ample time for discussion and questions allowed. The patient understood, was satisfied, and agreed to proceed.     HPI: Crystal Woods is a 52 y.o. female who presents for colonoscopy for colon cancer screening. Denies blood in stools, changes in bowel habits, or unintentional weight loss. Denies family history of colon cancer. Last colonoscopy at 52 years old was normal.  Past Medical History:  Diagnosis Date   Dysplasia of cervix    Hypertension    on meds   IBS (irritable bowel syndrome)    Seasonal allergies    Skin cancer    Vaginal Pap smear, abnormal     Past Surgical History:  Procedure Laterality Date   ABLATION  2004   uterine   BREAST ENHANCEMENT SURGERY  2018   CERVICAL BIOPSY  W/ LOOP ELECTRODE EXCISION     COLONOSCOPY     at age 48 in [place], [place]-dx with IBS-C   [place]    Prior to Admission medications   Medication Sig Start Date End Date Taking? Authorizing Provider  amLODipine (NORVASC) 5 MG tablet Take 1 tablet (5 mg total) by mouth daily. 09/28/21  Yes Jessup, Joy, NP  COLLAGEN PO Take 2 capsules by mouth daily at 6 (six) AM.   Yes [provider]  Cyanocobalamin (VITAMIN B 12 PO) Take 1 tablet by mouth daily at 6 (six) AM.   Yes [provider]  Levocetirizine Dihydrochloride (XYZAL PO) Take 1 tablet by mouth at bedtime.   Yes [provider]  Magnesium 400 MG CAPS Take 1 capsule by mouth daily at 6 (six) AM.   Yes [provider]  Multiple Vitamin (MULTIVITAMIN ADULT PO) Take 1 tablet  by mouth daily at 6 (six) AM.   Yes [provider]  NON FORMULARY Take 2 capsules by mouth daily. ARTICHOKE CAPSULE   Yes [provider]  Probiotic Product (PROBIOTIC-10) CHEW Chew 1 tablet by mouth daily at 6 (six) AM.   Yes [provider]  VITAMIN E PO Take 1 tablet by mouth daily at 6 (six) AM.   Yes [provider]  phentermine (ADIPEX-P) 37.5 MG tablet Take 37.5 mg by mouth daily before breakfast. 08/23/22   [provider]    Current Outpatient Medications  Medication Sig Dispense Refill   amLODipine (NORVASC) 5 MG tablet Take 1 tablet (5 mg total) by mouth daily. 90 tablet 3   COLLAGEN PO Take 2 capsules by mouth daily at 6 (six) AM.     Cyanocobalamin (VITAMIN B 12 PO) Take 1 tablet by mouth daily at 6 (six) AM.     Levocetirizine Dihydrochloride (XYZAL PO) Take 1 tablet by mouth at bedtime.     Magnesium 400 MG CAPS Take 1 capsule by mouth daily at 6 (six) AM.     Multiple Vitamin (MULTIVITAMIN ADULT PO) Take 1 tablet by mouth daily at 6 (six) AM.     NON FORMULARY Take 2 capsules by mouth daily. ARTICHOKE CAPSULE     Probiotic Product (PROBIOTIC-10) CHEW Chew  1 tablet by mouth daily at 6 (six) AM.     VITAMIN E PO Take 1 tablet by mouth daily at 6 (six) AM.     phentermine (ADIPEX-P) 37.5 MG tablet Take 37.5 mg by mouth daily before breakfast.     Current Facility-Administered Medications  Medication Dose Route Frequency Provider Last Rate Last Admin   0.9 %  sodium chloride infusion  500 mL Intravenous Once Sharyn Creamer, MD        Allergies as of 10/23/2022   (No Known Allergies)    Family History  Problem Relation Age of Onset   Hypertension Mother    Diabetes Mother    Heart murmur Mother    Diabetes Father    Hypertension Father    Parkinson's disease Father    Heart disease Father    Colon cancer Neg Hx    Colon polyps Neg Hx    Esophageal cancer Neg Hx    Stomach cancer Neg Hx    Rectal cancer Neg Hx      Social History   Socioeconomic History   Marital status: Married    Spouse name: Not on file   Number of children: Not on file   Years of education: Not on file   Highest education level: Not on file  Occupational History   Not on file  Tobacco Use   Smoking status: Never   Smokeless tobacco: Never  Vaping Use   Vaping Use: Never used  Substance and Sexual Activity   Alcohol use: Not Currently    Alcohol/week: 0.0 - 3.0 standard drinks of alcohol   Drug use: Never   Sexual activity: Yes    Partners: Male    Birth control/protection: Surgical  Other Topics Concern   Not on file  Social History Narrative   Not on file   Social Determinants of Health   Financial Resource Strain: Not on file  Food Insecurity: Not on file  Transportation Needs: Not on file  Physical Activity: Not on file  Stress: Not on file  Social Connections: Not on file  Intimate Partner Violence: Not on file    Physical Exam: Vital signs in last 24 hours: BP 135/87   Pulse 77   Temp 98 F (36.7 C)   Ht 5' 6"$  (1.676 m)   Wt 155 lb (70.3 kg)   SpO2 99%   BMI 25.02 kg/m  GEN: NAD EYE: Sclerae anicteric ENT: MMM CV: Non-tachycardic Pulm: No increased work of breathing GI: Soft, NT/ND NEURO:  Alert & Oriented   Christia Reading, MD [facility]  10/23/2022 8:35 AM

## 2022-10-23 NOTE — Patient Instructions (Signed)
Please read handouts provided. Await pathology results.   YOU HAD AN ENDOSCOPIC PROCEDURE TODAY AT [facility]:   Refer to the procedure report that was given to you for any specific questions about what was found during the examination.  If the procedure report does not answer your questions, please call your gastroenterologist to clarify.  If you requested that your care partner not be given the details of your procedure findings, then the procedure report has been included in a sealed envelope for you to review at your convenience later.  YOU SHOULD EXPECT: Some feelings of bloating in the abdomen. Passage of more gas than usual.  Walking can help get rid of the air that was put into your GI tract during the procedure and reduce the bloating. If you had a lower endoscopy (such as a colonoscopy or flexible sigmoidoscopy) you may notice spotting of blood in your stool or on the toilet paper. If you underwent a bowel prep for your procedure, you may not have a normal bowel movement for a few days.  Please Note:  You might notice some irritation and congestion in your nose or some drainage.  This is from the oxygen used during your procedure.  There is no need for concern and it should clear up in a day or so.  SYMPTOMS TO REPORT IMMEDIATELY:  Following lower endoscopy (colonoscopy or flexible sigmoidoscopy):  Excessive amounts of blood in the stool  Significant tenderness or worsening of abdominal pains  Swelling of the abdomen that is new, acute  Fever of 100F or higher   For urgent or emergent issues, a gastroenterologist can be reached at any hour by calling [phone]. Do not use MyChart messaging for urgent concerns.    DIET:  We do recommend a small meal at first, but then you may proceed to your regular diet.  Drink plenty of fluids but you should avoid alcoholic beverages for 24 hours.  ACTIVITY:  You should plan to take it easy for the rest of today and you  should NOT DRIVE or use heavy machinery until tomorrow (because of the sedation medicines used during the test).    FOLLOW UP: Our staff will call the number listed on your records the next business day following your procedure.  We will call around 7:15- 8:00 am to check on you and address any questions or concerns that you may have regarding the information given to you following your procedure. If we do not reach you, we will leave a message.     If any biopsies were taken you will be contacted by phone or by letter within the next 1-3 weeks.  Please call us at [phone] if you have not heard about the biopsies in 3 weeks.    SIGNATURES/CONFIDENTIALITY: You and/or your care partner have signed paperwork which will be entered into your electronic medical record.  These signatures attest to the fact that that the information above on your After Visit Summary has been reviewed and is understood.  Full responsibility of the confidentiality of this discharge information lies with you and/or your care-partner.

## 2022-10-23 NOTE — Op Note (Signed)
[place] Patient Name: Crystal Woods Procedure Date: 10/23/2022 8:43 AM MRN: [id number] Endoscopist: Adline Mango [place] , , [id number] Age: 52 Referring MD:  Date of Birth: 14-Feb-1971 Gender: Female Account #: [id number] Procedure:                Colonoscopy Indications:              Screening for colorectal malignant neoplasm Medicines:                Monitored Anesthesia Care Procedure:                Pre-Anesthesia Assessment:                           - Prior to the procedure, a History and Physical                            was performed, and patient medications and                            allergies were reviewed. The patient's tolerance of                            previous anesthesia was also reviewed. The risks                            and benefits of the procedure and the sedation                            options and risks were discussed with the patient.                            All questions were answered, and informed consent                            was obtained. Prior Anticoagulants: The patient has                            taken no anticoagulant or antiplatelet agents. ASA                            Grade Assessment: II - A patient with mild systemic                            disease. After reviewing the risks and benefits,                            the patient was deemed in satisfactory condition to                            undergo the procedure.                           After obtaining informed consent, the colonoscope  was passed under direct vision. Throughout the                            procedure, the patient's blood pressure, pulse, and                            oxygen saturations were monitored continuously. The                            Olympus [id number]) Colonoscope was                            introduced through the anus and advanced to the the                            terminal  ileum. The colonoscopy was performed                            without difficulty. The patient tolerated the                            procedure well. The quality of the bowel                            preparation was good. The terminal ileum, ileocecal                            valve, appendiceal orifice, and rectum were                            photographed. Scope In: 8:47:22 AM Scope Out: 9:02:53 AM Scope Withdrawal Time: 0 hours 12 minutes 22 seconds  Total Procedure Duration: 0 hours 15 minutes 31 seconds  Findings:                 The terminal ileum appeared normal.                           Two sessile polyps were found in the ascending                            colon and cecum. The polyps were 3 to 10 mm in                            size. These polyps were removed with a cold snare.                            Resection and retrieval were complete.                           Multiple diverticula were found in the sigmoid                            colon and descending colon.  Non-bleeding internal hemorrhoids were found during                            retroflexion. Complications:            No immediate complications. Estimated Blood Loss:     Estimated blood loss was minimal. Impression:               - The examined portion of the ileum was normal.                           - Two 3 to 10 mm polyps in the ascending colon and                            in the cecum, removed with a cold snare. Resected                            and retrieved.                           - Diverticulosis in the sigmoid colon and in the                            descending colon.                           - Non-bleeding internal hemorrhoids. Recommendation:           - Discharge patient to home (with escort).                           - Await pathology results.                           - The findings and recommendations were discussed                             with the patient. Dr Georgian Co "[name]" [place],  10/23/2022 9:05:42 AM

## 2022-10-23 NOTE — Progress Notes (Signed)
Pt's states no medical or surgical changes since previsit or office visit. 

## 2022-10-23 NOTE — Progress Notes (Signed)
Called to room to assist during endoscopic procedure.  Patient ID and intended procedure confirmed with present staff. Received instructions for my participation in the procedure from the performing physician.  

## 2022-10-24 ENCOUNTER — Telehealth: Payer: Self-pay

## 2022-10-24 NOTE — Telephone Encounter (Signed)
No answer, left message to call if having any issues or concerns, B.Louretta Tantillo RN 

## 2022-10-26 ENCOUNTER — Encounter: Payer: Self-pay | Admitting: Internal Medicine

## 2022-12-09 ENCOUNTER — Other Ambulatory Visit: Payer: Self-pay | Admitting: Medical-Surgical

## 2022-12-11 ENCOUNTER — Encounter: Payer: Self-pay | Admitting: Medical-Surgical

## 2023-01-05 IMAGING — US US PELVIS COMPLETE WITH TRANSVAGINAL
1 series · 13 of 25 positions shown · non-contrast
Comparison: 06/10/2021

CLINICAL DATA: Jo[place] pain, follow-up ovarian cyst, postmenopausal,
history of endometrial ablation



[Series 1: us pelvic complete with transvaginal · 13 of 89 slices shown]
[im 1/89]
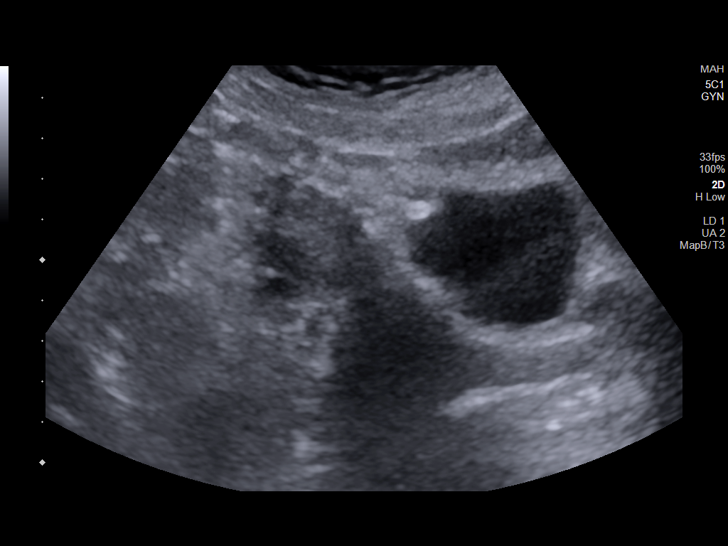
[im 8/89]
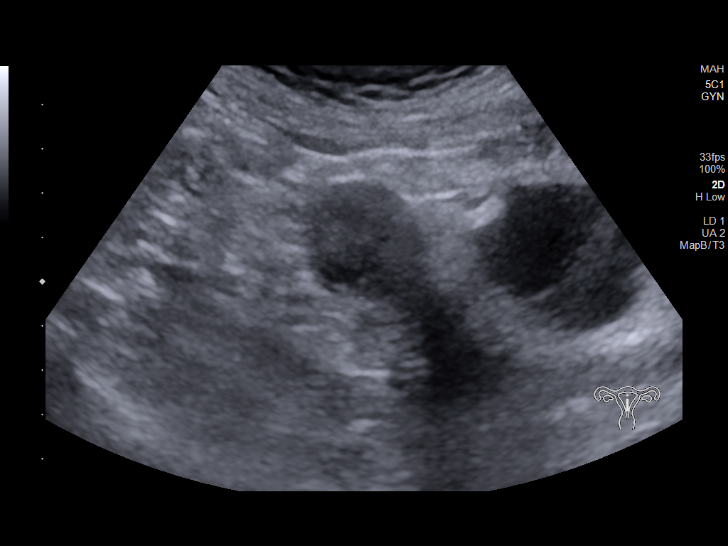
[im 15/89]
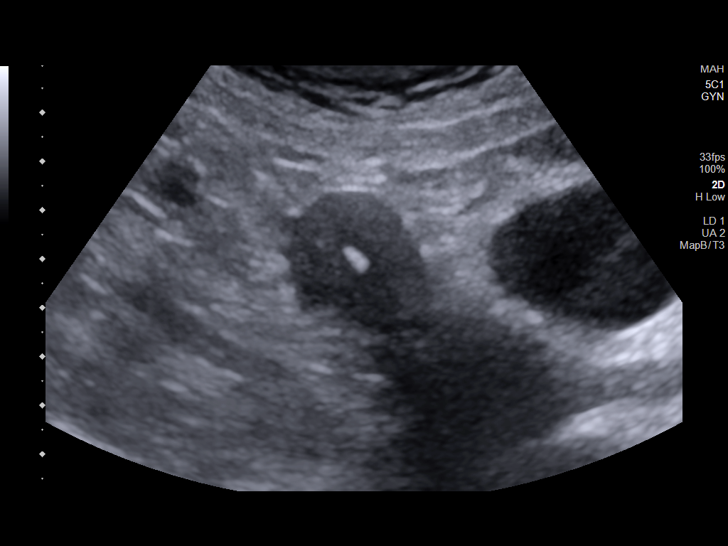
[im 23/89]
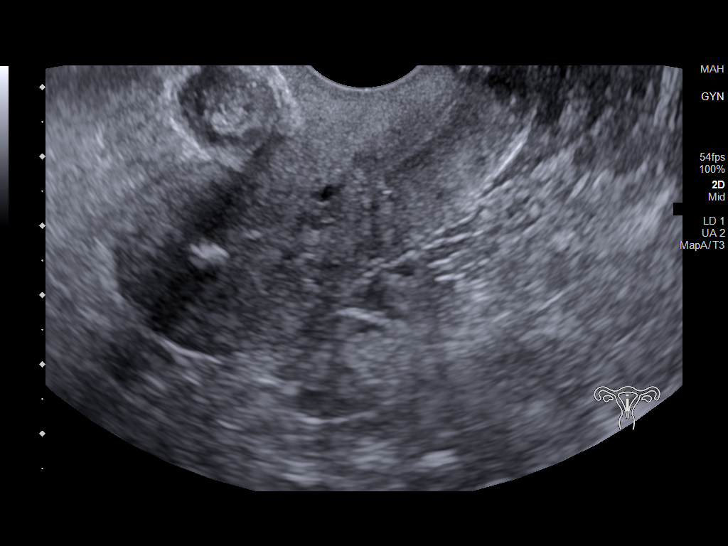
[im 30/89]
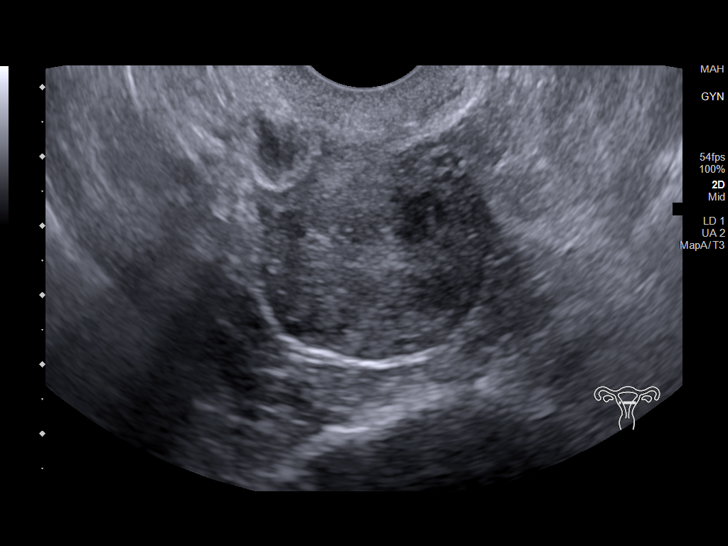
[im 37/89]
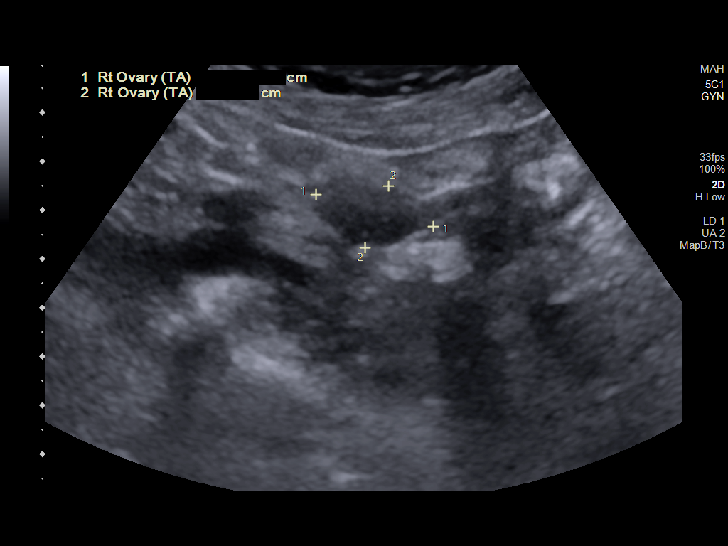
[im 45/89]
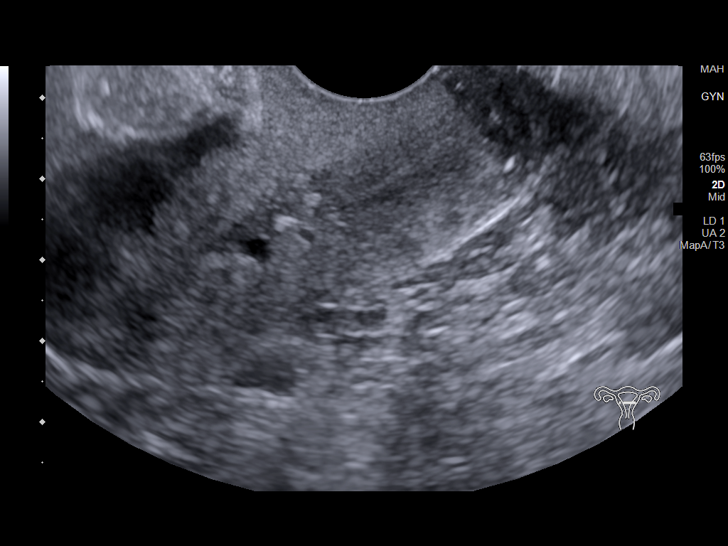
[im 52/89]
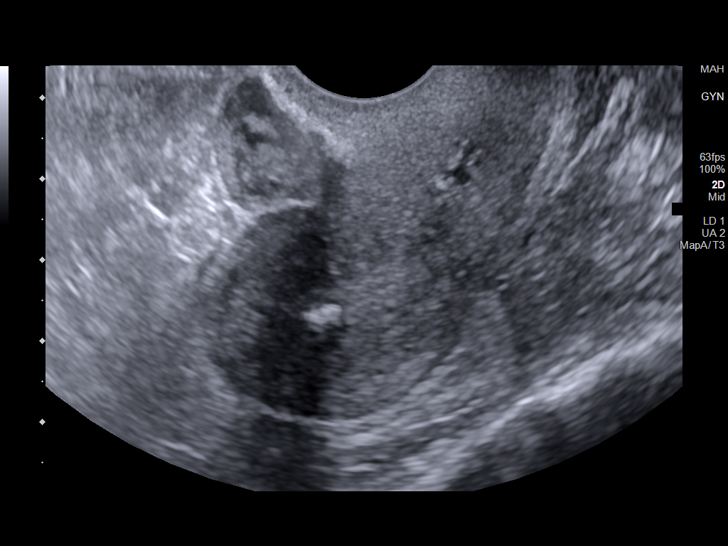
[im 59/89]
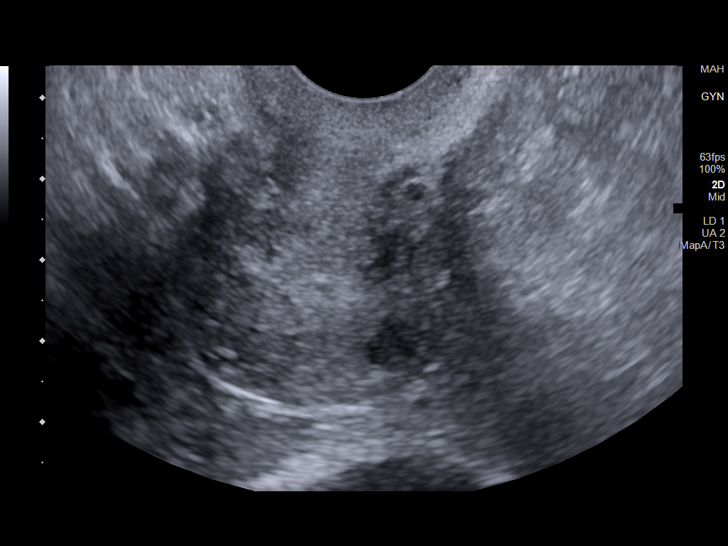
[im 67/89]
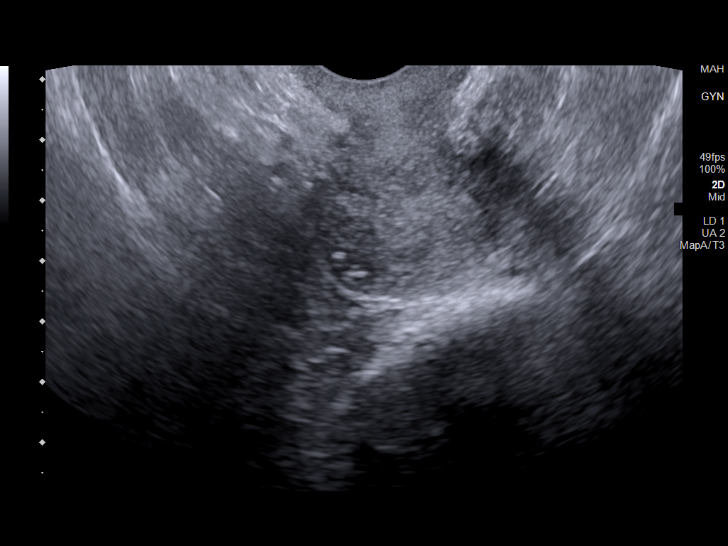
[im 74/89]
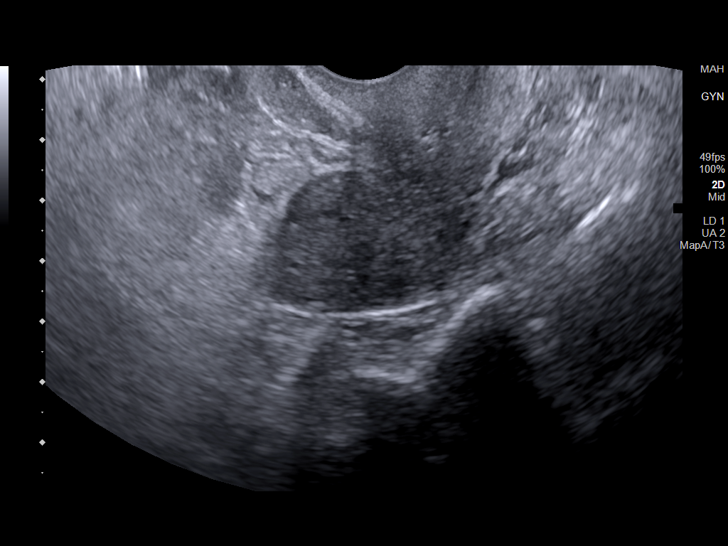
[im 81/89]
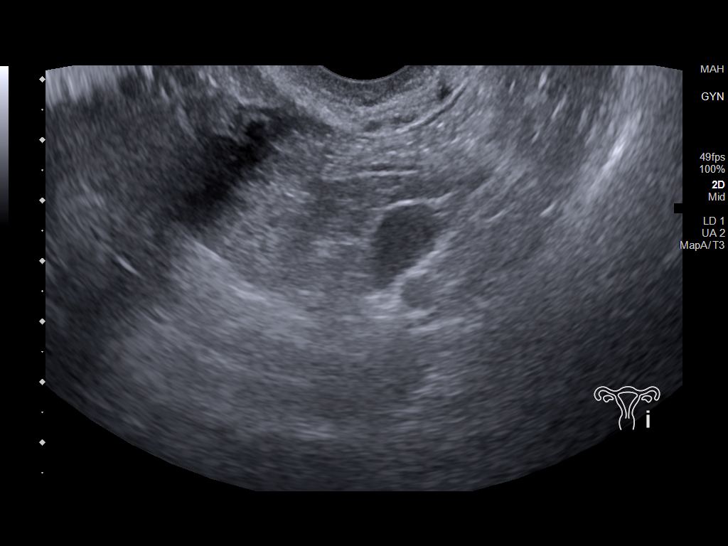
[im 89/89]
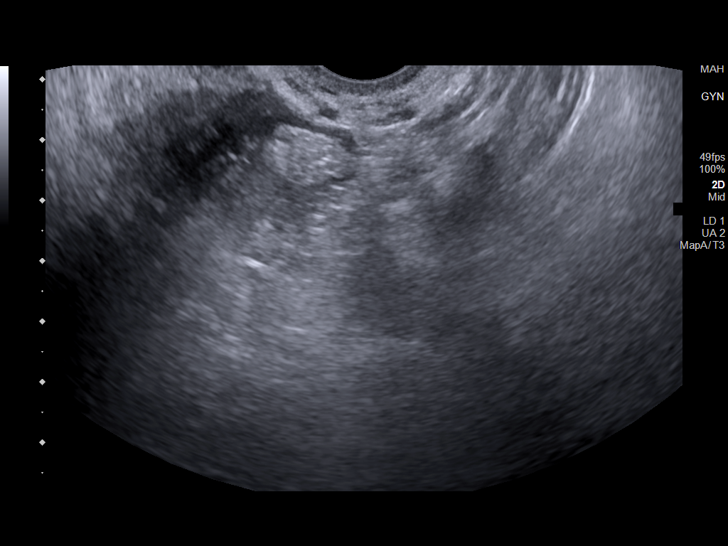

[13 of 25 positions shown; findings below may reference images not displayed]

FINDINGS: Uterus

Measurements: 6.9 x 2.8 x 3.8 cm = volume: 39 mL. Anteverted.
Heterogeneous myometrium. Small subendometrial cysts mid uterus 4 mm
diameter. Small intramural leiomyoma posterior upper uterus 11 x 8 x
9 mm. No additional masses.

Endometrium

Thickness: 5 mm. Echogenic focus at upper uterine segment
endometrial canal question calcification. Additional potential
calcification mid uterus. Trace endometrial fluid.

Right ovary

Measurements: 1.7 x 1.1 x 1.1 cm = volume: 1.0 mL. Normal morphology
without mass

Left ovary

Inadequately visualized due to bowel gas

Other findings

No free pelvic fluid or adnexal mass is seen.
IMPRESSION: Nonvisualization of LEFT ovary.

11 mm intramural leiomyoma posterior upper uterus.

Trace endometrial fluid with suspected calcification at upper
uterine segment endometrial canal, nonspecific.

Remainder of exam unremarkable.

## 2023-03-02 ENCOUNTER — Other Ambulatory Visit: Payer: Self-pay | Admitting: Family Medicine

## 2023-03-02 DIAGNOSIS — Z1231 Encounter for screening mammogram for malignant neoplasm of breast: Secondary | ICD-10-CM

## 2023-04-12 ENCOUNTER — Encounter: Payer: Self-pay | Admitting: Medical-Surgical

## 2023-06-04 ENCOUNTER — Encounter: Payer: Self-pay | Admitting: Obstetrics and Gynecology

## 2023-06-05 ENCOUNTER — Other Ambulatory Visit: Payer: Self-pay | Admitting: *Deleted

## 2023-06-05 MED ORDER — AMLODIPINE BESYLATE 5 MG PO TABS: 5.0000 mg | ORAL_TABLET | Freq: Every day | ORAL | 0 refills | Status: DC

## 2023-06-05 NOTE — Telephone Encounter (Cosign Needed)
Pt called requesting that Dr Para March refill her Norvasc until her appt.  Per Dr Para March she will give her 1 RF but she will need to get with her PCP for any further RF's.  She has her annual pap with Dr Para March 06/27/23.

## 2023-06-27 ENCOUNTER — Other Ambulatory Visit: Payer: Self-pay | Admitting: Obstetrics and Gynecology

## 2023-06-27 ENCOUNTER — Encounter: Payer: Self-pay | Admitting: Obstetrics and Gynecology

## 2023-06-27 ENCOUNTER — Ambulatory Visit: Payer: 59 | Admitting: Obstetrics and Gynecology

## 2023-06-27 ENCOUNTER — Other Ambulatory Visit (HOSPITAL_COMMUNITY)
Admission: RE | Admit: 2023-06-27 | Discharge: 2023-06-27 | Disposition: A | Discharge status: Home / Self Care | Payer: 59 | Source: Ambulatory Visit | Attending: Obstetrics and Gynecology | Admitting: Obstetrics and Gynecology

## 2023-06-27 VITALS — BP 121/83 | HR 80 | Resp 16 | Ht 66.0 in | Wt 142.0 lb

## 2023-06-27 DIAGNOSIS — Z1231 Encounter for screening mammogram for malignant neoplasm of breast: Secondary | ICD-10-CM

## 2023-06-27 DIAGNOSIS — Z01419 Encounter for gynecological examination (general) (routine) without abnormal findings: Secondary | ICD-10-CM | POA: Insufficient documentation

## 2023-06-27 DIAGNOSIS — R2231 Localized swelling, mass and lump, right upper limb: Secondary | ICD-10-CM

## 2023-06-27 DIAGNOSIS — N649 Disorder of breast, unspecified: Secondary | ICD-10-CM

## 2023-06-27 NOTE — Addendum Note (Signed)
Addended by: Milas Hock A on: 06/27/2023 01:59 PM   Modules accepted: Orders

## 2023-06-27 NOTE — Progress Notes (Signed)
ANNUAL EXAM Patient name: Crystal Woods MRN [id number]  Date of birth: 1970/12/28 Chief Complaint:   Annual Exam  History of Present Illness:   Crystal Woods is a 52 y.o. G1P0010 female being seen today for a routine annual exam.   Current concerns: None. Has not had the same pain issues since I last saw her. She is doing hormonal pellet therapy with [facility] - she has T and E pellets. Takes PO prometrium. Feeling much better in all aspects since starting.   Recent hormone levels:  Free t - 1.8 E2 - 125.6 FSH 11.0 P - 10.9 She will send me the ones from 3 months ago via MyChart.   No LMP recorded. Patient has had an ablation.  Last MXR: 07/2021 - has been given a hard time about scheduling mammogram. We will get her set up downstairs with [facility].   Last Pap/Pap History:  2018: pap/hpv wnl.   Health Maintenance Due  Topic Date Due   Cervical Cancer Screening (HPV/Pap Cotest)  11/30/2019   Zoster Vaccines- Shingrix (2 of 2) 04/26/2022   MAMMOGRAM  09/30/2022   INFLUENZA VACCINE  Never done    Review of Systems:   Pertinent items are noted in HPI Denies any headaches, blurred vision, fatigue, shortness of breath, chest pain, abdominal pain, abnormal vaginal discharge/itching/odor/irritation, problems with periods, bowel movements, urination, or intercourse unless otherwise stated above.  Pertinent History Reviewed:  Reviewed past medical,surgical, social and family history.  Reviewed problem list, medications and allergies. Physical Assessment:   Vitals:   06/27/23 1311  BP: 121/83  Pulse: 80  Resp: 16  Weight: 142 lb (64.4 kg)  Height: 5\' 6"  (1.676 m)  Body mass index is 22.92 kg/m.   Physical Examination:  General appearance - well appearing, and in no distress Mental status - alert, oriented to person, place, and time Psych:  She has a normal mood and affect Skin - warm and dry, normal color, no suspicious lesions noted Chest - effort normal Heart -  normal rate  Breasts - breasts appear normal, no suspicious masses, no skin or nipple changes or axillary nodes Abdomen - soft, nontender, nondistended, no masses or organomegaly Pelvic -  VULVA: normal appearing vulva with no masses, tenderness or lesions  VAGINA: normal appearing vagina with normal color and discharge, no lesions  CERVIX: normal appearing cervix without discharge or lesions, no CMT UTERUS: uterus is felt to be normal size, shape, consistency and nontender  ADNEXA: No adnexal masses or tenderness noted. Extremities:  No swelling or varicosities noted  Chaperone present for exam  No results found for this or any previous visit (from the past 24 hour(s)).  Assessment & Plan:  Zehra was seen today for annual exam.  Diagnoses and all orders for this visit:  Encounter for annual routine gynecological examination  - Cervical cancer screening: Discussed guidelines. Pap with HPV done - Breast Health: Encouraged self breast awareness/SBE. Discussed limits of clinical breast exam for detecting breast cancer. Discussed importance of annual MXR. Rx given for MXR - Climacteric/Sexual health: Reviewed typical and atypical symptoms of menopause/peri-menopause. Discussed PMB and to call if any amount of spotting.  - Colonoscopy:  10/2022 - F/U 12 months and prn     No orders of the defined types were placed in this encounter.   Meds: No orders of the defined types were placed in this encounter.   Follow-up: Return in about 1 year (around 06/26/2024) for annual.  Milas Hock, MD 06/27/2023  1:53 PM

## 2023-06-28 ENCOUNTER — Encounter: Payer: Self-pay | Admitting: Obstetrics and Gynecology

## 2023-07-03 LAB — CYTOLOGY - PAP
Comment: NEGATIVE
Diagnosis: NEGATIVE
High risk HPV: NEGATIVE

## 2023-07-13 ENCOUNTER — Ambulatory Visit
Admission: RE | Admit: 2023-07-13 | Discharge: 2023-07-13 | Disposition: A | Discharge status: Home / Self Care | Payer: 59 | Source: Ambulatory Visit | Attending: Obstetrics and Gynecology | Admitting: Obstetrics and Gynecology

## 2023-07-13 DIAGNOSIS — R2231 Localized swelling, mass and lump, right upper limb: Secondary | ICD-10-CM

## 2023-07-14 ENCOUNTER — Encounter: Payer: Self-pay | Admitting: Obstetrics and Gynecology

## 2023-07-26 ENCOUNTER — Encounter: Payer: 59 | Admitting: Medical-Surgical

## 2023-08-28 ENCOUNTER — Other Ambulatory Visit: Payer: Self-pay | Admitting: Obstetrics and Gynecology

## 2023-09-03 ENCOUNTER — Other Ambulatory Visit: Payer: Self-pay | Admitting: Obstetrics and Gynecology

## 2023-10-30 MED ORDER — AMLODIPINE BESYLATE 5 MG PO TABS: 5.0000 mg | ORAL_TABLET | Freq: Every day | ORAL | 0 refills | Status: DC

## 2024-01-07 ENCOUNTER — Ambulatory Visit: Payer: Self-pay

## 2024-01-07 NOTE — Telephone Encounter (Signed)
 Copied from CRM [id number]. Topic: Clinical - Red Word Triage >> Jan 07, 2024  8:33 AM Danelle Dunning F wrote: Red Word that prompted transfer to Nurse Triage:   Shingles outbreak; Patient would like to schedule an acute visit Reason for Disposition . [1] Shingles rash (matches SYMPTOMS) AND [2] onset < 72 hours ago (3 days)  Answer Assessment - Initial Assessment Questions 1. APPEARANCE of RASH: "Describe the rash."      Small blisters 2. LOCATION: "Where is the rash located?"      Left buttock  3. NUMBER: "How many spots are there?"  4 inch circumference 5. ONSET: "When did the rash start?"      Left  6. ITCHING: "Does the rash itch?" If Yes, ask: "How bad is the itch?"  (Scale 0-10; or none, mild, moderate, severe)    No 7. PAIN: "Does the rash hurt?" If Yes, ask: "How bad is the pain?"  (Scale 0-10; or none, mild, moderate, severe)    - NONE (0): no pain    - MILD (1-3): doesn't interfere with normal activities     - MODERATE (4-7): interferes with normal activities or awakens from sleep     - SEVERE (8-10): excruciating pain, unable to do any normal activities     Just describes as uncomfortable and can feel a know under the skin and blisters will appear  Answer Assessment - Initial Assessment Questions 1. APPEARANCE of RASH: "Describe the rash."      Patch of blisters same area  2. LOCATION: "Where is the rash located?"      Left buttock 3. ONSET: "When did the rash start?"      2 months ago then clears up and another will pop up  4. ITCHING: "Does the rash itch?" If Yes, ask: "How bad is the itch?"  (Scale 1-10; or mild, moderate, severe)     No  5. PAIN: "Does the rash hurt?" If Yes, ask: "How bad is the pain?"  (Scale 0-10; or none, mild, moderate, severe)    - NONE (0): no pain    - MILD (1-3): doesn't interfere with normal activities     - MODERATE (4-7): interferes with normal activities or awakens from sleep     - SEVERE (8-10): excruciating pain, unable to do any normal  activities     no 6. OTHER SYMPTOMS: "Do you have any other symptoms?" (e.g., fever)     no 7. PREGNANCY: "Is there any chance you are pregnant?" "When was your last menstrual period?"     N/a  Protocols used: Rash or Redness - Localized-A-AH, Shingles (Zoster)-A-AH

## 2024-01-07 NOTE — Telephone Encounter (Signed)
 FYI - Patient has been scheduled with provider on 01/10/24.

## 2024-01-08 ENCOUNTER — Ambulatory Visit (INDEPENDENT_AMBULATORY_CARE_PROVIDER_SITE_OTHER): Admitting: Medical-Surgical

## 2024-01-08 ENCOUNTER — Encounter: Payer: Self-pay | Admitting: Medical-Surgical

## 2024-01-08 VITALS — BP 138/90 | HR 77 | Resp 20 | Ht 66.0 in | Wt 130.1 lb

## 2024-01-08 DIAGNOSIS — B029 Zoster without complications: Secondary | ICD-10-CM

## 2024-01-08 MED ORDER — VALACYCLOVIR HCL 1 G PO TABS
1000.0000 mg | ORAL_TABLET | Freq: Three times a day (TID) | ORAL | 0 refills | Status: AC
Start: 2024-01-08 — End: 2024-01-15

## 2024-01-08 MED ORDER — TRIAMCINOLONE ACETONIDE 0.1 % EX CREA: 1.0000 | TOPICAL_CREAM | Freq: Two times a day (BID) | CUTANEOUS | 0 refills | Status: AC

## 2024-01-08 NOTE — Telephone Encounter (Signed)
 LMVM for the patient to contact the office to schedule to come in today instead of waiting till 01/10/2024. Joy Jessup stated that we can use same day slots.

## 2024-01-08 NOTE — Progress Notes (Signed)
        Established patient visit  History, exam, impression, and plan:  1. Herpes zoster without complication (Primary) Very pleasant 53 year old female presenting today with reports of a history of having frequent outbreaks on her left buttock.  She reports that this flare started 2 days ago.  Has tried taking Aleve for the pain that is associated with the rash but has had minimal relief.  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  She does have a history of chickenpox at the age of 64 and has only had 1 Shingrix vaccine in 2023.  Evaluation, she has vesicular rash on erythematous base and scattered spots along the left buttock.  Denies history and current risk for genital herpes.  Reports that she has been tested recently and found negative for herpes.  Plan to treat as if this is shingles.  Starting Valtrex 1000 mg 3 times daily for 7 days.  Offered prednisone however she declined due to side effects.  Offered injectable steroid, also declined.  Sending in triamcinolone twice daily for up to 14 days. - valACYclovir (VALTREX) 1000 MG tablet; Take 1 tablet (1,000 mg total) by mouth 3 (three) times daily for 7 days.  Dispense: 21 tablet; Refill: 0 - triamcinolone cream (KENALOG) 0.1 %; Apply 1 Application topically 2 (two) times daily.  Dispense: 30 g; Refill:   Procedures performed this visit: None.  Return if symptoms worsen or fail to improve.  __________________________________ Maryl Snook, DNP, APRN, FNP-BC [facility] [place]

## 2024-01-10 ENCOUNTER — Ambulatory Visit: Admitting: Medical-Surgical
